# Patient Record
Sex: Male | Born: 1975 | Race: Black or African American | Hispanic: No | Marital: Single | Smoking: Never smoker
Health system: Southern US, Community
[De-identification: ages and names within clinical notes are randomized; demographics above are authoritative.]

## PROBLEM LIST (undated history)

## (undated) DIAGNOSIS — I10 Essential (primary) hypertension: Secondary | ICD-10-CM

---

## 1997-09-09 ENCOUNTER — Emergency Department (HOSPITAL_COMMUNITY): Admission: EM | Admit: 1997-09-09 | Discharge: 1997-09-09 | Payer: Self-pay | Admitting: Emergency Medicine

## 1998-03-27 ENCOUNTER — Emergency Department (HOSPITAL_COMMUNITY): Admission: EM | Admit: 1998-03-27 | Discharge: 1998-03-27 | Payer: Self-pay | Admitting: Emergency Medicine

## 1998-04-02 ENCOUNTER — Emergency Department (HOSPITAL_COMMUNITY): Admission: EM | Admit: 1998-04-02 | Discharge: 1998-04-02 | Payer: Self-pay | Admitting: Emergency Medicine

## 2004-08-10 ENCOUNTER — Emergency Department (HOSPITAL_COMMUNITY): Admission: EM | Admit: 2004-08-10 | Discharge: 2004-08-10 | Payer: Self-pay | Admitting: Emergency Medicine

## 2007-01-15 ENCOUNTER — Emergency Department (HOSPITAL_COMMUNITY): Admission: EM | Admit: 2007-01-15 | Discharge: 2007-01-16 | Payer: Self-pay | Admitting: Emergency Medicine

## 2010-05-26 ENCOUNTER — Emergency Department (HOSPITAL_COMMUNITY)
Admission: EM | Admit: 2010-05-26 | Discharge: 2010-05-26 | Disposition: A | Discharge status: Home / Self Care | Payer: Self-pay | Attending: Emergency Medicine | Admitting: Emergency Medicine

## 2010-05-26 DIAGNOSIS — K089 Disorder of teeth and supporting structures, unspecified: Secondary | ICD-10-CM | POA: Insufficient documentation

## 2010-05-26 DIAGNOSIS — K029 Dental caries, unspecified: Secondary | ICD-10-CM | POA: Insufficient documentation

## 2010-06-11 ENCOUNTER — Emergency Department (HOSPITAL_COMMUNITY)
Admission: EM | Admit: 2010-06-11 | Discharge: 2010-06-11 | Disposition: A | Discharge status: Home / Self Care | Payer: Self-pay | Attending: Emergency Medicine | Admitting: Emergency Medicine

## 2010-06-11 DIAGNOSIS — K0381 Cracked tooth: Secondary | ICD-10-CM | POA: Insufficient documentation

## 2010-06-11 DIAGNOSIS — K029 Dental caries, unspecified: Secondary | ICD-10-CM | POA: Insufficient documentation

## 2010-06-11 DIAGNOSIS — R220 Localized swelling, mass and lump, head: Secondary | ICD-10-CM | POA: Insufficient documentation

## 2011-01-18 ENCOUNTER — Emergency Department (HOSPITAL_COMMUNITY)
Admission: EM | Admit: 2011-01-18 | Discharge: 2011-01-18 | Disposition: A | Discharge status: Home / Self Care | Payer: Self-pay | Attending: Emergency Medicine | Admitting: Emergency Medicine

## 2011-01-18 ENCOUNTER — Encounter: Payer: Self-pay | Admitting: Emergency Medicine

## 2011-01-18 DIAGNOSIS — R221 Localized swelling, mass and lump, neck: Secondary | ICD-10-CM | POA: Insufficient documentation

## 2011-01-18 DIAGNOSIS — K047 Periapical abscess without sinus: Secondary | ICD-10-CM | POA: Insufficient documentation

## 2011-01-18 DIAGNOSIS — K089 Disorder of teeth and supporting structures, unspecified: Secondary | ICD-10-CM | POA: Insufficient documentation

## 2011-01-18 DIAGNOSIS — K029 Dental caries, unspecified: Secondary | ICD-10-CM | POA: Insufficient documentation

## 2011-01-18 DIAGNOSIS — R220 Localized swelling, mass and lump, head: Secondary | ICD-10-CM | POA: Insufficient documentation

## 2011-01-18 DIAGNOSIS — K0381 Cracked tooth: Secondary | ICD-10-CM | POA: Insufficient documentation

## 2011-01-18 MED ORDER — AMOXICILLIN 500 MG PO CAPS: 500.0000 mg | ORAL_CAPSULE | Freq: Three times a day (TID) | ORAL | Status: AC

## 2011-01-18 NOTE — ED Provider Notes (Signed)
History     CSN: [id number] Arrival date & time: 01/18/2011  9:30 AM   First MD Initiated Contact with Patient 01/18/11 [phone]      Chief Complaint  Patient presents with  . Facial Swelling  . Dental Injury    (Consider location/radiation/quality/duration/timing/severity/associated sxs/prior treatment) Patient is a 35 y.o. male presenting with dental injury. The history is provided by the patient.  Dental Injury This is a new problem. The current episode started in the past 7 days. The problem has been gradually worsening. Pertinent negatives include no chills, fever, headaches, neck pain or sore throat. The symptoms are aggravated by eating. He has tried nothing for the symptoms.  Pt states his tooth chipped about 6mon ago. Since then swelling on and off around that tooth. Yesterday, facial swelling. Denies pain, only when palpating the swelling. Denies fever, chills, malaise.   History reviewed. No pertinent past medical history.  History reviewed. No pertinent past surgical history.  History reviewed. No pertinent family history.  History  Substance Use Topics  . Smoking status: Never Smoker   . Smokeless tobacco: Never Used  . Alcohol Use: Yes      Review of Systems  Constitutional: Negative for fever, chills and appetite change.  HENT: Positive for facial swelling. Negative for ear pain, sore throat, mouth sores, trouble swallowing, neck pain and neck stiffness.   Eyes: Negative.   Respiratory: Negative.   Cardiovascular: Negative.   Gastrointestinal: Negative.   Genitourinary: Negative.   Skin: Negative.   Neurological: Negative for headaches.  Psychiatric/Behavioral: Negative.     Allergies  Review of patient's allergies indicates no known allergies.  Home Medications   Current Outpatient Rx  Name Route Sig Dispense Refill  . ASPIRIN PO Oral Take 1 tablet by mouth daily as needed. For swellling reducer.       BP 138/91  Pulse 80  Temp(Src) 98.1 F  (36.7 C) (Oral)  Resp 18  SpO2 99%  Physical Exam  Nursing note and vitals reviewed. Constitutional: He is oriented to person, place, and time. He appears well-developed and well-nourished. No distress.  HENT:  Head: Normocephalic and atraumatic.  Right Ear: External ear normal.  Left Ear: External ear normal.  Nose: Nose normal.  Mouth/Throat: Oropharynx is clear and moist.       Minor facial swelling over left mandible. Partial fracture and decay of left lower 1st molar. Tender to palpation. Mild surrounding gum swelling, tenderness  Eyes: Conjunctivae are normal.  Neck: Neck supple.  Cardiovascular: Normal rate, regular rhythm and normal heart sounds.   Musculoskeletal: Normal range of motion.  Lymphadenopathy:    He has no cervical adenopathy.  Neurological: He is alert and oriented to person, place, and time.  Skin: Skin is warm and dry. No rash noted.  Psychiatric: He has a normal mood and affect.    ED Course  Procedures (including critical care time)  Exam consistent with dental carries and dental abscess. Will start on antibiotics. Follow up with oral surgery.   MDM          Lottie Mussel, PA 01/18/11 1015

## 2011-01-18 NOTE — ED Notes (Signed)
Pt reports has a chipped tooth and began with abcess inside left jaw. Symptoms began on Wednesday.

## 2011-01-18 NOTE — ED Provider Notes (Signed)
Medical screening examination/treatment/procedure(s) were performed by non-physician practitioner and as supervising physician I was immediately available for consultation/collaboration.  Damoni Causby, MD 01/18/11 1151 

## 2012-12-09 ENCOUNTER — Encounter (HOSPITAL_COMMUNITY): Payer: Self-pay | Admitting: Emergency Medicine

## 2012-12-09 ENCOUNTER — Emergency Department (HOSPITAL_COMMUNITY)
Admission: EM | Admit: 2012-12-09 | Discharge: 2012-12-09 | Disposition: A | Discharge status: Home / Self Care | Payer: Self-pay | Attending: Emergency Medicine | Admitting: Emergency Medicine

## 2012-12-09 DIAGNOSIS — H6120 Impacted cerumen, unspecified ear: Secondary | ICD-10-CM | POA: Insufficient documentation

## 2012-12-09 DIAGNOSIS — H61891 Other specified disorders of right external ear: Secondary | ICD-10-CM

## 2012-12-09 DIAGNOSIS — H7190 Unspecified cholesteatoma, unspecified ear: Secondary | ICD-10-CM | POA: Insufficient documentation

## 2012-12-09 DIAGNOSIS — H6121 Impacted cerumen, right ear: Secondary | ICD-10-CM

## 2012-12-09 NOTE — ED Notes (Signed)
Pt reports "muffled sound" in right ear. Reports cleaning this AM but states "I think I pushed the wax further in." Denies pain. Ear WDL.

## 2012-12-09 NOTE — ED Notes (Signed)
Pt reports ear fullness and sounds muffled right ear. No acute distress noted.

## 2012-12-09 NOTE — ED Provider Notes (Signed)
CSN: [id number]     Arrival date & time 12/09/12  1152 History   First MD Initiated Contact with Patient 12/09/12 1213     Chief Complaint  Patient presents with  . Otalgia   (Consider location/radiation/quality/duration/timing/severity/associated sxs/prior Treatment) The history is provided by the patient.  RAMAL ECKHARDT is a 37 y.o. male history of cerumen impaction here with muffled and fullness in the right ear. Symptoms since this morning. He tried using some drops without much relief. Denies any fevers or chills or sore throat. Had similar symptoms a year ago with cerumen impaction.    History reviewed. No pertinent past medical history. History reviewed. No pertinent past surgical history. History reviewed. No pertinent family history. History  Substance Use Topics  . Smoking status: Never Smoker   . Smokeless tobacco: Never Used  . Alcohol Use: Yes    Review of Systems  HENT: Positive for ear pain.   All other systems reviewed and are negative.    Allergies  Review of patient's allergies indicates no known allergies.  Home Medications  No current outpatient prescriptions on file. BP 164/86  Pulse 81  Temp(Src) 98.2 F (36.8 C)  Resp 19  Wt 171 lb (77.565 kg)  SpO2 100% Physical Exam  Nursing note and vitals reviewed. Constitutional: He is oriented to person, place, and time. He appears well-developed and well-nourished.  HENT:  Head: Normocephalic.  Mouth/Throat: Oropharynx is clear and moist.  R cerumen impaction, L partial cerumen impaction but TM normal on L side.   Eyes: Conjunctivae are normal. Pupils are equal, round, and reactive to light.  Neck: Normal range of motion. Neck supple.  Cardiovascular: Normal rate, regular rhythm and normal heart sounds.   Pulmonary/Chest: Effort normal.  Abdominal: Soft.  Musculoskeletal: Normal range of motion.  Neurological: He is alert and oriented to person, place, and time.  Skin: Skin is warm and dry.   Psychiatric: He has a normal mood and affect. His behavior is normal. Judgment and thought content normal.    ED Course  EAR CERUMEN REMOVAL Date/Time: 12/09/2012 1:12 PM Performed by: Richardean Canal Authorized by: Richardean Canal Consent: Verbal consent obtained. Risks and benefits: risks, benefits and alternatives were discussed Consent given by: patient Patient understanding: patient states understanding of the procedure being performed Patient consent: the patient's understanding of the procedure matches consent given Procedure consent: procedure consent matches procedure scheduled Relevant documents: relevant documents present and verified Patient identity confirmed: verbally with patient and arm band Local anesthetic: none Ceruminolytics applied: Ceruminolytics applied prior to the procedure. Location details: right ear Procedure type: curette and irrigation Patient sedated: no Patient tolerance: Patient tolerated the procedure well with no immediate complications.   (including critical care time) Labs Review Labs Reviewed - No data to display Imaging Review No results found.  EKG Interpretation   None       MDM  No diagnosis found. ZIYON SOLTAU is a 37 y.o. male here with cerumen impaction. Nursing disimpacted cerumen.   1:12 PM Nurse unable to get cerumen out. I irrigated R ear and cleared out cerumen. No complications and he felt better. There seem to be a polyp in R ear. R TM seemed unremarkable. Recommend debrox drops at home and ENT f/u.    Richardean Canal, MD 12/09/12 [phone]

## 2013-09-20 ENCOUNTER — Encounter (HOSPITAL_COMMUNITY): Payer: Self-pay | Admitting: Emergency Medicine

## 2013-09-20 ENCOUNTER — Emergency Department (HOSPITAL_COMMUNITY)
Admission: EM | Admit: 2013-09-20 | Discharge: 2013-09-20 | Disposition: A | Discharge status: Home / Self Care | Payer: Self-pay | Attending: Emergency Medicine | Admitting: Emergency Medicine

## 2013-09-20 DIAGNOSIS — L03011 Cellulitis of right finger: Secondary | ICD-10-CM

## 2013-09-20 DIAGNOSIS — L03019 Cellulitis of unspecified finger: Principal | ICD-10-CM

## 2013-09-20 DIAGNOSIS — L02519 Cutaneous abscess of unspecified hand: Secondary | ICD-10-CM | POA: Insufficient documentation

## 2013-09-20 DIAGNOSIS — M79609 Pain in unspecified limb: Secondary | ICD-10-CM | POA: Insufficient documentation

## 2013-09-20 MED ORDER — CEPHALEXIN 500 MG PO CAPS: 500.0000 mg | ORAL_CAPSULE | Freq: Four times a day (QID) | ORAL | Status: DC

## 2013-09-20 NOTE — Discharge Instructions (Signed)
Please read and follow all provided instructions.  Your diagnoses today include:  1. Cellulitis of finger of right hand    Tests performed today include:  Vital signs. See below for your results today.   Medications prescribed:   Keflex (cephalexin) - antibiotic  You have been prescribed an antibiotic medicine: take the entire course of medicine even if you are feeling better. Stopping early can cause the antibiotic not to work.  Take any prescribed medications only as directed.   Home care instructions:  Follow any educational materials contained in this packet. Keep affected area above the level of your heart when possible. Soak area three times a day with warm soapy water. Do not apply alcohol or hydrogen peroxide. Cover the area if it draining or weeping.   Follow-up instructions: Return to the Emergency Department in 48 hours for a recheck if your symptoms are not significantly improved, especially if you notice worsening swelling around the nail fold.   Please follow-up with your primary care provider in the next 1 week for further evaluation of your symptoms.   Return instructions:  Return to the Emergency Department if you have:  Fever  Worsening symptoms  Worsening pain  Worsening swelling  Redness of the skin that moves away from the affected area, especially if it streaks away from the affected area   Any other emergent concerns  Your vital signs today were: BP 159/92   Pulse 87   Temp(Src) 98.8 F (37.1 C) (Oral)   Resp 18   Ht 6\' 1"  (1.854 m)   Wt 269 lb (122.018 kg)   BMI 35.50 kg/m2   SpO2 100% If your blood pressure (BP) was elevated above 135/85 this visit, please have this repeated by your doctor within one month. --------------

## 2013-09-20 NOTE — ED Provider Notes (Signed)
CSN: [id number]     Arrival date & time 09/20/13  7829 History  This chart was scribed for Justin Bleacher, PA-C, working with Justin Roots, MD by Justin Espinoza, ED Scribe. The patient was seen in TR09C/TR09C. The patient's care was started at 9:15 AM.     Chief Complaint  Patient presents with  . Hand Pain   Patient is a 38 y.o. male presenting with hand pain. The history is provided by the patient. No language interpreter was used.  Hand Pain   HPI Comments: Justin Espinoza is a 38 y.o. male who presents to the Emergency Department complaining of mild swelling and pain in his right index finger beginning three days ago. Patient reports that the swelling became more severe yesterday. He states that he has experienced similar pain in the past. He denies any recent injury or trauma to the finger. He denies biting his fingernails.   Patient does not have a PCP.   History reviewed. No pertinent past medical history. History reviewed. No pertinent past surgical history. No family history on file. History  Substance Use Topics  . Smoking status: Never Smoker   . Smokeless tobacco: Never Used  . Alcohol Use: Yes    Review of Systems  Constitutional: Negative for activity change.  Musculoskeletal: Positive for arthralgias and joint swelling. Negative for back pain, gait problem and neck pain.  Skin: Negative for wound.  Neurological: Negative for weakness and numbness.      Allergies  Review of patient's allergies indicates no known allergies.  Home Medications   Prior to Admission medications   Not on File   Triage Vitals: BP 159/92  Pulse 87  Temp(Src) 98.8 F (37.1 C) (Oral)  Resp 18  Ht 6\' 1"  (1.854 m)  Wt 269 lb (122.018 kg)  BMI 35.50 kg/m2  SpO2 100% Physical Exam  Nursing note and vitals reviewed. Constitutional: He appears well-developed and well-nourished. No distress.  HENT:  Head: Normocephalic and atraumatic.  Eyes: Conjunctivae and EOM are normal.   Neck: Normal range of motion. Neck supple. No tracheal deviation present.  Cardiovascular: Normal rate and normal pulses.   Pulmonary/Chest: Effort normal. No respiratory distress.  Musculoskeletal: Normal range of motion. He exhibits edema and tenderness.  There is very slight fullness of radial border of nail consistent with cellulitis. There is no fluctuance. No definite paronychia/eponychia.   Neurological: He is alert. No sensory deficit.  Motor, sensation, and vascular distal to the injury is fully intact.   Skin: Skin is warm and dry.  Psychiatric: He has a normal mood and affect. His behavior is normal.    ED Course  Procedures (including critical care time) DIAGNOSTIC STUDIES: Oxygen Saturation is 100% on room air, normal by my interpretation.    COORDINATION OF CARE: 9:21 AM-Discussed treatment plan which includes soaking finger in warm water and treatment with antibiotics with pt at bedside and pt agreed to plan.     Labs Review Labs Reviewed - No data to display  Imaging Review No results found.   EKG Interpretation None      9:31 AM Patient seen and examined.   Vital signs reviewed and are as follows: Filed Vitals:   09/20/13 0857  BP: 159/92  Pulse: 87  Temp: 98.8 F (37.1 C)  Resp: 18   We discussed attempt at drainage versus warm soaks/oral antibiotics/possible need for return for drainage in 48-72 hours.  Patient would like to do the latter. He understands that this  may be a developing paronychia and he may need to return for definitive drainage.  Tylenol/ibuprofen for pain.    MDM   Final diagnoses:  Cellulitis of finger of right hand   Patient with mild cellulitis of the finger with pain. No injury. There is no definite eponychia or paronychia at this time. I doubt there would be any purulence expressed with drainage. Offered attempt at drainage versus watchful waiting with oral antibiotics and patient elects to wait, soak at home.   This  chart was scribed in my presence and reviewed by me personally.   Renne CriglerJoshua Gabi Mcfate, PA-C 09/20/13 [phone]

## 2013-09-20 NOTE — ED Provider Notes (Signed)
Medical screening examination/treatment/procedure(s) were performed by non-physician practitioner and as supervising physician I was immediately available for consultation/collaboration.     Suzi RootsKevin E Kla Bily, MD 09/20/13 [phone]

## 2013-09-20 NOTE — ED Notes (Signed)
Patient states has had right index finger swelling and pain since Saturday.   Patient states worsened in the last few days.

## 2013-09-28 ENCOUNTER — Emergency Department (HOSPITAL_COMMUNITY)
Admission: EM | Admit: 2013-09-28 | Discharge: 2013-09-28 | Disposition: A | Discharge status: Home / Self Care | Payer: Self-pay | Attending: Emergency Medicine | Admitting: Emergency Medicine

## 2013-09-28 ENCOUNTER — Encounter (HOSPITAL_COMMUNITY): Payer: Self-pay | Admitting: Emergency Medicine

## 2013-09-28 DIAGNOSIS — M79609 Pain in unspecified limb: Secondary | ICD-10-CM | POA: Insufficient documentation

## 2013-09-28 DIAGNOSIS — L03019 Cellulitis of unspecified finger: Secondary | ICD-10-CM | POA: Insufficient documentation

## 2013-09-28 DIAGNOSIS — L03011 Cellulitis of right finger: Secondary | ICD-10-CM

## 2013-09-28 MED ORDER — CEPHALEXIN 500 MG PO CAPS: 500.0000 mg | ORAL_CAPSULE | Freq: Four times a day (QID) | ORAL | Status: DC

## 2013-09-28 NOTE — ED Notes (Signed)
Pt was seen here one week ago for finger pain and swelling around the cuticle, was put on antibiotics and it improved until he was biting his nail and now its swollen again.

## 2013-09-28 NOTE — Discharge Instructions (Signed)
Warm soaks several times a day. Keep clean, bacitracin topically twice a day. Follow up if not improving.    Paronychia Paronychia is an inflammatory reaction involving the folds of the skin surrounding the fingernail. This is commonly caused by an infection in the skin around a nail. The most common cause of paronychia is frequent wetting of the hands (as seen with bartenders, food servers, nurses or others who wet their hands). This makes the skin around the fingernail susceptible to infection by bacteria (germs) or fungus. Other predisposing factors are:  Aggressive manicuring.  Nail biting.  Thumb sucking. The most common cause is a staphylococcal (a type of germ) infection, or a fungal (Candida) infection. When caused by a germ, it usually comes on suddenly with redness, swelling, pus and is often painful. It may get under the nail and form an abscess (collection of pus), or form an abscess around the nail. If the nail itself is infected with a fungus, the treatment is usually prolonged and may require oral medicine for up to one year. Your caregiver will determine the length of time treatment is required. The paronychia caused by bacteria (germs) may largely be avoided by not pulling on hangnails or picking at cuticles. When the infection occurs at the tips of the finger it is called felon. When the cause of paronychia is from the herpes simplex virus (HSV) it is called herpetic whitlow. TREATMENT  When an abscess is present treatment is often incision and drainage. This means that the abscess must be cut open so the pus can get out. When this is done, the following home care instructions should be followed. HOME CARE INSTRUCTIONS   It is important to keep the affected fingers very dry. Rubber or plastic gloves over cotton gloves should be used whenever the hand must be placed in water.  Keep wound clean, dry and dressed as suggested by your caregiver between warm soaks or warm  compresses.  Soak in warm water for fifteen to twenty minutes three to four times per day for bacterial infections. Fungal infections are very difficult to treat, so often require treatment for long periods of time.  For bacterial (germ) infections take antibiotics (medicine which kill germs) as directed and finish the prescription, even if the problem appears to be solved before the medicine is gone.  Only take over-the-counter or prescription medicines for pain, discomfort, or fever as directed by your caregiver. SEEK IMMEDIATE MEDICAL CARE IF:  You have redness, swelling, or increasing pain in the wound.  You notice pus coming from the wound.  You have a fever.  You notice a bad smell coming from the wound or dressing. Document Released: 07/30/2000 Document Revised: 04/28/2011 Document Reviewed: 03/31/2008 [facility] Patient Information 2015 [employer], [place]. This information is not intended to replace advice given to you by your health care provider. Make sure you discuss any questions you have with your health care provider.

## 2013-09-28 NOTE — ED Provider Notes (Signed)
CSN: [id number]     Arrival date & time 09/28/13  2440 History  This chart was scribed for non-physician practitioner, Jaynie Crumble, PA-C working with Samuel Jester, DO, by Jarvis Morgan, ED Scribe. This patient was seen in room TR06C/TR06C and the patient's care was started at 9:23 AM.    Chief Complaint  Patient presents with  . Hand Pain      The history is provided by the patient. No language interpreter was used.   HPI Comments: Justin Espinoza is a 38 y.o. male who presents to the Emergency Department complaining of constant, aching right finger pain onset two weeks ago. The pain is localized in the right index finger. Pt states he was seen here one week ago and finger pain and swelling around the cuticle. He states he was put on antibiotics and it improved. He states that 3 days ago he was eating and he accidentally bit his finger and he notes that it is now swollen again. There is associated swelling and redness of the finger. He reports that he has been soaking his finger with soap and warm water 3x a day with mild improvement. He denies any fever, numbness, weakness, nausea, or emesis.     History reviewed. No pertinent past medical history. History reviewed. No pertinent past surgical history. History reviewed. No pertinent family history. History  Substance Use Topics  . Smoking status: Never Smoker   . Smokeless tobacco: Never Used  . Alcohol Use: Yes    Review of Systems  Constitutional: Negative for fever and chills.  Gastrointestinal: Negative for nausea and vomiting.  Musculoskeletal: Positive for arthralgias (fight index finger) and joint swelling.  Skin: Positive for color change (redness and swelling of right index finger). Negative for wound.  Neurological: Negative for weakness and numbness.  All other systems reviewed and are negative.     Allergies  Review of patient's allergies indicates no known allergies.  Home Medications   Prior to  Admission medications   Medication Sig Start Date End Date Taking? Authorizing Provider  cephALEXin (KEFLEX) 500 MG capsule Take 1 capsule (500 mg total) by mouth 4 (four) times daily. 09/20/13   Renne Crigler, PA-C   Triage Vitals: BP 152/117  Pulse 80  Temp(Src) 98.2 F (36.8 C) (Oral)  Resp 18  SpO2 100%  Physical Exam  Nursing note and vitals reviewed. Constitutional: He is oriented to person, place, and time. He appears well-developed and well-nourished. No distress.  HENT:  Head: Normocephalic and atraumatic.  Eyes: Conjunctivae and EOM are normal.  Neck: Neck supple. No tracheal deviation present.  Cardiovascular: Normal rate.   Pulmonary/Chest: Effort normal. No respiratory distress.  Musculoskeletal: Normal range of motion.  Swelling over the distal right index finger around the nail, consistent with paronychia. No drainage. No pain with ROM of the finger at all joints.   Neurological: He is alert and oriented to person, place, and time.  Skin: Skin is warm and dry.  Psychiatric: He has a normal mood and affect. His behavior is normal.    ED Course  Procedures (including critical care time)  DIAGNOSTIC STUDIES: Oxygen Saturation is 100% on RA, normal by my interpretation.    COORDINATION OF CARE: INCISION AND DRAINAGE Performed by: Jaynie Crumble A Consent: Verbal consent obtained. Risks and benefits: risks, benefits and alternatives were discussed Type: paronychia  Body area: right index finger  Anesthesia: none  Incision was made with a scalpel.   Complexity: complex Blunt dissection to break up  loculations  Drainage: purulent  Drainage amount: moderate   Patient tolerance: Patient tolerated the procedure well with no immediate complications.     Labs Review Labs Reviewed - No data to display  Imaging Review No results found.   EKG Interpretation None      MDM   Final diagnoses:  Paronychia of finger of right hand   Pt with  paronychia to right index finger. Just finished antibiotics, states it helped but swelling is worsening now. Paronychia incised and drained with  Moderate pus. Will d/c home with antibiotics, warm soaks at home, follow up as needed.   Filed Vitals:   09/28/13 0831  BP: 152/117  Pulse: 80  Temp: 98.2 F (36.8 C)  TempSrc: Oral  Resp: 18  SpO2: 100%     I personally performed the services described in this documentation, which was scribed in my presence. The recorded information has been reviewed and is accurate.      Lottie Musselatyana A Cailey Trigueros, PA-C 09/28/13 1030

## 2013-09-29 NOTE — ED Provider Notes (Signed)
Medical screening examination/treatment/procedure(s) were performed by non-physician practitioner and as supervising physician I was immediately available for consultation/collaboration.   EKG Interpretation None        Ashante Yellin, DO 09/29/13 1702 

## 2016-07-07 ENCOUNTER — Emergency Department (HOSPITAL_COMMUNITY): Payer: Self-pay

## 2016-07-07 ENCOUNTER — Encounter (HOSPITAL_COMMUNITY): Payer: Self-pay | Admitting: Emergency Medicine

## 2016-07-07 ENCOUNTER — Emergency Department (HOSPITAL_COMMUNITY)
Admission: EM | Admit: 2016-07-07 | Discharge: 2016-07-07 | Disposition: A | Discharge status: Home / Self Care | Payer: Self-pay | Attending: Emergency Medicine | Admitting: Emergency Medicine

## 2016-07-07 DIAGNOSIS — M1712 Unilateral primary osteoarthritis, left knee: Secondary | ICD-10-CM | POA: Insufficient documentation

## 2016-07-07 DIAGNOSIS — Z79899 Other long term (current) drug therapy: Secondary | ICD-10-CM | POA: Insufficient documentation

## 2016-07-07 DIAGNOSIS — M25462 Effusion, left knee: Secondary | ICD-10-CM | POA: Insufficient documentation

## 2016-07-07 NOTE — ED Provider Notes (Addendum)
[facility]PT Provider Note   CSN: [id number] Arrival date & time: 07/07/16  [id number]     History   Chief Complaint Chief Complaint  Patient presents with  . Knee Pain    HPI Justin Espinoza is a 41 y.o. male.  Patient is a healthy 41 year old male with no significant past medical history presenting today with persistent knee swelling. Patient states for years with strenuous activity or crawling on his knees they will swell and then eventually get better on their own. 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 It is not improved but he denies trying ice, elevation, compression or anti-inflammatories. He denies any pain but just persistent swelling and popping when he bends his knee. He denies any prior surgeries or known trauma.   The history is provided by the patient.  Knee Pain   This is a recurrent problem. The current episode started more than 1 week ago. The problem occurs constantly. The problem has not changed since onset.The pain is present in the left knee. The pain is at a severity of 0/10. The patient is experiencing no pain. Associated symptoms include stiffness. Associated symptoms comments: swelling. The symptoms are aggravated by activity. He has tried nothing for the symptoms. The treatment provided no relief. There has been no history of extremity trauma.    History reviewed. No pertinent past medical history.  There are no active problems to display for this patient.   History reviewed. No pertinent surgical history.     Home Medications    Prior to Admission medications   Medication Sig Start Date End Date Taking? Authorizing Provider  cephALEXin (KEFLEX) 500 MG capsule Take 1 capsule (500 mg total) by mouth 4 (four) times daily. 09/20/13   Renne CriglerGeiple, Joshua, PA-C  cephALEXin (KEFLEX) 500 MG capsule Take 1 capsule (500 mg total) by mouth 4 (four) times daily. 09/28/13   Jaynie CrumbleKirichenko, Tatyana,  PA-C    Family History No family history on file.  Social History Social History  Substance Use Topics  . Smoking status: Never Smoker  . Smokeless tobacco: Never Used  . Alcohol use Yes     Allergies   Patient has no known allergies.   Review of Systems Review of Systems  Cardiovascular:       Blood pressure is elevated today which patient states always happens when he comes to the doctor because he gets very nervous. When he checks outside of the hospital his blood pressure is normal.  Musculoskeletal: Positive for stiffness.  All other systems reviewed and are negative.    Physical Exam Updated Vital Signs BP (!) 190/106 (BP Location: Right Arm)   Pulse (!) 109   Temp 98.2 F (36.8 C) (Oral)   Resp 18   SpO2 100%   Physical Exam  Constitutional: He is oriented to person, place, and time. He appears well-developed and well-nourished. No distress.  HENT:  Head: Normocephalic and atraumatic.  Eyes: Pupils are equal, round, and reactive to light.  Cardiovascular: Normal rate.   Pulmonary/Chest: Effort normal.  Musculoskeletal: He exhibits edema. He exhibits no tenderness.       Left knee: He exhibits swelling. He exhibits normal range of motion, no deformity, no bony tenderness, normal meniscus and no MCL laxity. No tenderness found. No medial joint line, no lateral joint line, no MCL, no LCL and no patellar tendon tenderness noted.  Legs: Neurological: He is alert and oriented to person, place, and time.  Nursing note and vitals reviewed.    ED Treatments / Results  Labs (all labs ordered are listed, but only abnormal results are displayed) Labs Reviewed - No data to display  EKG  EKG Interpretation None       Radiology Dg Knee Complete 4 Views Left  Result Date: 07/07/2016 CLINICAL DATA:  Chronic pain. EXAM: LEFT KNEE - COMPLETE 4+ VIEW COMPARISON:  No prior . FINDINGS: Tricompartment degenerative change. Tiny loose body appears to be present.  Moderate knee joint effusion. No evidence of fracture dislocation IMPRESSION: Tricompartment degenerative change with small loose body. Moderate knee joint effusion . Electronically Signed   By: Maisie Fus  Register   On: 07/07/2016 09:09    Procedures Procedures (including critical care time)  Medications Ordered in ED Medications - No data to display   Initial Impression / Assessment and Plan / ED Course  I have reviewed the triage vital signs and the nursing notes.  Pertinent labs & imaging results that were available during my care of the patient were reviewed by me and considered in my medical decision making (see chart for details).     Patient with persistent knee swelling most likely from overuse. He has no pain with palpation but does have cracking and popping with bending and extending the knee.  He denies any trauma or prior surgeries. Feel most likely patient's symptoms are related to degeneration either from arthritis or cartilage issues. No signs of gout or septic joint. X-ray pending.  9:43 AM Imaging with tricompartment degenerative changes with small loose bodies and moderate joint effusion. Discussed findings with the patient. Discussed with him low impact activities and using a knee sleeve and Aleve when necessary. Given referral to orthopedics.    Final Clinical Impressions(s) / ED Diagnoses   Final diagnoses:  Effusion of left knee  Primary osteoarthritis of left knee    New Prescriptions New Prescriptions   No medications on file     Gwyneth Sprout, MD 07/07/16 1610    Gwyneth Sprout, MD 07/07/16 [phone]

## 2016-07-07 NOTE — ED Notes (Signed)
Pt transported to xray 

## 2016-07-07 NOTE — ED Notes (Signed)
ED Provider at bedside. 

## 2016-07-07 NOTE — ED Triage Notes (Signed)
Pt arrives to the ED with c/o of recurrent left knee swelling pt states this has happen before after physical activity of bending down usually resolves after a week. Pt states this time left knee has been swollen for over 1 week, denies any pain just feels "hard to bend". Pt is ambulatory with steady gait.  Joint is appropriate color for ethnicity and warm. Mildly swollen.

## 2016-10-03 ENCOUNTER — Encounter (HOSPITAL_COMMUNITY): Payer: Self-pay | Admitting: *Deleted

## 2016-10-03 ENCOUNTER — Emergency Department (HOSPITAL_COMMUNITY): Payer: Self-pay

## 2016-10-03 ENCOUNTER — Emergency Department (HOSPITAL_COMMUNITY)
Admission: EM | Admit: 2016-10-03 | Discharge: 2016-10-03 | Disposition: A | Discharge status: Home / Self Care | Payer: Self-pay | Attending: Emergency Medicine | Admitting: Emergency Medicine

## 2016-10-03 DIAGNOSIS — S62394A Other fracture of fourth metacarpal bone, right hand, initial encounter for closed fracture: Secondary | ICD-10-CM | POA: Insufficient documentation

## 2016-10-03 DIAGNOSIS — Y998 Other external cause status: Secondary | ICD-10-CM | POA: Insufficient documentation

## 2016-10-03 DIAGNOSIS — Y939 Activity, unspecified: Secondary | ICD-10-CM | POA: Insufficient documentation

## 2016-10-03 DIAGNOSIS — Y929 Unspecified place or not applicable: Secondary | ICD-10-CM | POA: Insufficient documentation

## 2016-10-03 DIAGNOSIS — W228XXA Striking against or struck by other objects, initial encounter: Secondary | ICD-10-CM | POA: Insufficient documentation

## 2016-10-03 NOTE — Progress Notes (Signed)
Orthopedic Tech Progress Note Patient Details:  Justin Espinoza 07-21-1975 [id number]  Ortho Devices Type of Ortho Device: Ace wrap, Ulna gutter splint Ortho Device/Splint Interventions: Application   Saul Fordyce 10/03/2016, 12:41 PM
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
## 2016-10-03 NOTE — ED Notes (Signed)
Ortho paged. 

## 2016-10-03 NOTE — Discharge Instructions (Signed)
Keep the splint on until evaluated by orthopedics. Use ibuprofen or Tylenol as needed for pain control. Follow-up with orthopedic doctor for further evaluation of your hand. Return to the emergency room if you develop worsening pain, numbness, tingling, or any new or worsening symptoms.
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
## 2016-11-18 ENCOUNTER — Emergency Department (HOSPITAL_COMMUNITY)
Admission: EM | Admit: 2016-11-18 | Discharge: 2016-11-18 | Disposition: A | Discharge status: Home / Self Care | Payer: Self-pay | Attending: Emergency Medicine | Admitting: Emergency Medicine

## 2016-11-18 ENCOUNTER — Encounter (HOSPITAL_COMMUNITY): Payer: Self-pay

## 2016-11-18 DIAGNOSIS — M79644 Pain in right finger(s): Secondary | ICD-10-CM | POA: Insufficient documentation

## 2016-11-18 DIAGNOSIS — M79645 Pain in left finger(s): Secondary | ICD-10-CM | POA: Insufficient documentation

## 2016-11-18 DIAGNOSIS — Z79899 Other long term (current) drug therapy: Secondary | ICD-10-CM | POA: Insufficient documentation

## 2016-11-18 NOTE — Discharge Instructions (Signed)
Please read instructions below.  Soak/flush your fingers with warm water, multiple times per day. You can take Advil/ibuprofen every 6 hours as needed for pain. Follow up with your primary care or urgent care if you develop worsening pain and swelling. Return to the ER for fever, worsening redness, or new or worsening symptoms.

## 2016-11-18 NOTE — ED Triage Notes (Signed)
Pt presents for evaluation of L middle finger. 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 Denies pain to area. No redness or significant swelling. Denies fevers. Reports hx of same.

## 2016-11-18 NOTE — ED Provider Notes (Signed)
[facility] Provider Note   CSN: [id number] Arrival date & time: 11/18/16  2725     History   Chief Complaint Chief Complaint  Patient presents with  . Finger Injury    HPI Justin Espinoza is a 41 y.o. male Sunday with acute onset of left third finger and right fifth finger pain surrounding the nail that began yesterday. 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 States he has had a cuticle infection in the past, which was lanced. He denies purulent drainage, swelling, decreased range of motion, wounds, fever or chills, or other complaints. Denies recent injuries. No history of immunocompromise.  The history is provided by the patient.    History reviewed. No pertinent past medical history.  There are no active problems to display for this patient.   History reviewed. No pertinent surgical history.     Home Medications    Prior to Admission medications   Medication Sig Start Date End Date Taking? Authorizing Provider  cephALEXin (KEFLEX) 500 MG capsule Take 1 capsule (500 mg total) by mouth 4 (four) times daily. 09/20/13   Renne Crigler, PA-C  cephALEXin (KEFLEX) 500 MG capsule Take 1 capsule (500 mg total) by mouth 4 (four) times daily. 09/28/13   Jaynie Crumble, PA-C    Family History No family history on file.  Social History Social History  Substance Use Topics  . Smoking status: Never Smoker  . Smokeless tobacco: Never Used  . Alcohol use Yes     Allergies   Patient has no known allergies.   Review of Systems Review of Systems  Musculoskeletal: Negative for joint swelling.       Finger pain  Skin: Negative for color change and wound.     Physical Exam Updated Vital Signs BP (!) 180/116 (BP Location: Right Arm)   Pulse 90   Temp 98.3 F (36.8 C) (Oral)   Resp 16   Ht  (1.854 m)   Wt 122.9 kg (271 lb)   SpO2 100%   BMI 35.75 kg/m   Physical Exam    Constitutional: He appears well-developed and well-nourished. No distress.  HENT:  Head: Normocephalic and atraumatic.  Eyes: Conjunctivae are normal.  Cardiovascular: Normal rate and intact distal pulses.   Pulmonary/Chest: Effort normal.  Musculoskeletal:  Left middle finger with mild tenderness over lateral aspect surrounding the nail, however not erythematous, fluctuant or edematous. Right fifth finger with mild tenderness over the lateral aspect surrounding fingernail, however not erythematous fluctuant or edematous. Digits with full range of motion.  Psychiatric: He has a normal mood and affect. His behavior is normal.  Nursing note and vitals reviewed.    ED Treatments / Results  Labs (all labs ordered are listed, but only abnormal results are displayed) Labs Reviewed - No data to display  EKG  EKG Interpretation None       Radiology No results found.  Procedures Procedures (including critical care time)  Medications Ordered in ED Medications - No data to display   Initial Impression / Assessment and Plan / ED Course  I have reviewed the triage vital signs and the nursing notes.  Pertinent labs & imaging results that were available during my care of the patient were reviewed by me and considered in my medical decision making (see chart for details).    Pt with finger pain, with concern of developing paronychia. On exam no fluctuant abscess noted,  minimal to no erythema or edema. Recommend warm compresses and monitoring at home. Discussed strict return precautions and follow-up instructions. Also recommended PCP follow-up for blood pressure. Per chart review, multiple visits with elevated blood pressure, however patient is asymptomatic today.  Discussed results, findings, treatment and follow up. Patient advised of return precautions. Patient verbalized understanding and agreed with plan.  Final Clinical Impressions(s) / ED Diagnoses   Final diagnoses:  Pain  of left middle finger  Pain of finger of right hand    New Prescriptions New Prescriptions   No medications on file     Russo, [place] N, PA-C 11/18/16 1201    Charlynne Pander, MD 11/19/16 [phone]

## 2017-06-01 ENCOUNTER — Encounter (HOSPITAL_COMMUNITY): Payer: Self-pay | Admitting: Emergency Medicine

## 2017-06-01 ENCOUNTER — Emergency Department (HOSPITAL_COMMUNITY)
Admission: EM | Admit: 2017-06-01 | Discharge: 2017-06-01 | Disposition: A | Discharge status: Home / Self Care | Payer: Self-pay | Attending: Emergency Medicine | Admitting: Emergency Medicine

## 2017-06-01 DIAGNOSIS — H6121 Impacted cerumen, right ear: Secondary | ICD-10-CM | POA: Insufficient documentation

## 2017-06-01 DIAGNOSIS — H6123 Impacted cerumen, bilateral: Secondary | ICD-10-CM

## 2017-06-01 MED ORDER — DOCUSATE SODIUM 50 MG/5ML PO LIQD
50.0000 mg | Freq: Once | ORAL | Status: AC
  Administered 2017-06-01: 50 mg via OTIC
  Filled 2017-06-01: qty 10

## 2017-06-01 NOTE — ED Provider Notes (Signed)
TIME SEEN: 4:30 AM  CHIEF COMPLAINT: Cerumen impaction  HPI: Patient is a 42 year old male with history of previous cerumen impaction who presents to the emergency department with fullness and discomfort in the right ear.  He suspects that he has another cerumen impaction.  No sore throat or dental pain.  No facial redness, swelling.  No fever.  States he does not use Q-tips but continues to get recurrent cerumen impactions.  No pain in the left ear.  ROS: See HPI Constitutional: no fever  Eyes: no drainage  ENT: no runny nose   Cardiovascular:  no chest pain  Resp: no SOB  GI: no vomiting GU: no dysuria Integumentary: no rash  Allergy: no hives  Musculoskeletal: no leg swelling  Neurological: no slurred speech ROS otherwise negative  PAST MEDICAL HISTORY/PAST SURGICAL HISTORY:  History reviewed. No pertinent past medical history.  MEDICATIONS:  Prior to Admission medications   Medication Sig Start Date End Date Taking? Authorizing Provider  cephALEXin (KEFLEX) 500 MG capsule Take 1 capsule (500 mg total) by mouth 4 (four) times daily. 09/20/13   Renne CriglerGeiple, Joshua, PA-C  cephALEXin (KEFLEX) 500 MG capsule Take 1 capsule (500 mg total) by mouth 4 (four) times daily. 09/28/13   Kirichenko, Lemont Fillersatyana, PA-C    ALLERGIES:  No Known Allergies  SOCIAL HISTORY:  Social History   Tobacco Use  . Smoking status: Never Smoker  . Smokeless tobacco: Never Used  Substance Use Topics  . Alcohol use: Yes    FAMILY HISTORY: No family history on file.  EXAM: BP (!) 190/102 (BP Location: Left Arm)   Pulse 99   Temp 98.1 F (36.7 C) (Oral)   Resp 16   Ht 6\' 1"  (1.854 m)   Wt 122.5 kg (270 lb)   SpO2 100%   BMI 35.62 kg/m  CONSTITUTIONAL: Alert and oriented and responds appropriately to questions. Well-appearing; well-nourished HEAD: Normocephalic EYES: Conjunctivae clear, pupils appear equal, EOMI ENT: normal nose; moist mucous membranes; No pharyngeal erythema or petechiae, no  tonsillar hypertrophy or exudate, no uvular deviation, no unilateral swelling, no trismus or drooling, no muffled voice, normal phonation, no stridor, no dental caries present, no drainable dental abscess noted, no Ludwig's angina, tongue sits flat in the bottom of the mouth, no angioedema, no facial erythema or warmth, no facial swelling; no pain with movement of the neck.  Patient has bilateral cerumen impaction and I am unable to visualize either TM.  After cerumen has been removed from the right ear the right TM is TMs are clear without erythema, purulence, bulging, perforation, effusion.  No sign of foreign body in the external auditory canal. No inflammation, erythema or drainage from the external auditory canal. No signs of mastoiditis. No pain with manipulation of the pinna bilaterally.  Still unable to visualize the left TM.  Patient refuses cerumen impaction of the left side. NECK: Supple, no meningismus, no nuchal rigidity, no LAD  CARD: RRR; S1 and S2 appreciated; no murmurs, no clicks, no rubs, no gallops RESP: Normal chest excursion without splinting or tachypnea; breath sounds clear and equal bilaterally; no wheezes, no rhonchi, no rales, no hypoxia or respiratory distress, speaking full sentences ABD/GI: Normal bowel sounds; non-distended; soft, non-tender, no rebound, no guarding, no peritoneal signs, no hepatosplenomegaly BACK:  The back appears normal and is non-tender to palpation, there is no CVA tenderness EXT: Normal ROM in all joints; non-tender to palpation; no edema; normal capillary refill; no cyanosis, no calf tenderness or swelling  SKIN: Normal color for age and race; warm; no rash NEURO: Moves all extremities equally PSYCH: The patient's mood and manner are appropriate. Grooming and personal hygiene are appropriate.  MEDICAL DECISION MAKING: Patient here with bilateral cerumen impaction.  Cerumen has been disimpacted from the right ear.  He refuses disimpaction from the  left side.  No sign of superimposed infection, effusion or perforation.  Reports feeling better.  Will discharge home.  At this time, I do not feel there is any life-threatening condition present. I have reviewed and discussed all results (EKG, imaging, lab, urine as appropriate) and exam findings with patient/family. I have reviewed nursing notes and appropriate previous records.  I feel the patient is safe to be discharged home without further emergent workup and can continue workup as an outpatient as needed. Discussed usual and customary return precautions. Patient/family verbalize understanding and are comfortable with this plan.  Outpatient follow-up has been provided if needed. All questions have been answered.   .Ear Cerumen Removal Date/Time: 06/01/2017 6:39 AM Performed by: Kizzy Olafson, Layla Maw, DO Authorized by: Odalys Win, Layla Maw, DO   Consent:    Consent obtained:  Verbal   Consent given by:  Patient   Risks discussed:  Bleeding, dizziness, incomplete removal, infection, pain and TM perforation   Alternatives discussed:  No treatment Procedure details:    Location:  R ear   Procedure type: irrigation   Post-procedure details:    Inspection:  TM intact   Hearing quality:  Improved   Patient tolerance of procedure:  Tolerated well, no immediate complications       Jaycub Noorani, Layla Maw, DO 06/01/17 [phone]

## 2017-06-01 NOTE — ED Triage Notes (Signed)
Pt presents with R ear fullness. Pt reports he has had impactions in the past and feels this may be what is causing discomfort.

## 2017-06-01 NOTE — Discharge Instructions (Signed)
To find a primary care or specialty doctor please call [phone] or [phone] to access "[facility] Find a Doctor Service." ° °You may also go on the [facility] website at [email or web address] ° °There are also multiple [facility], [facility] and [facility] practices throughout the [place] that are frequently accepting new patients. You may find a clinic that is close to your home and contact them. ° °[facility] -  °[address] °[place] [address] °[phone] ° ° °[facility] -  °[address] °[place] [address] °[phone] ° ° °[facility] - °[address]  °[place] [address] °[phone] ° ° °
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
## 2019-08-07 ENCOUNTER — Encounter (HOSPITAL_COMMUNITY): Payer: Self-pay

## 2019-08-07 ENCOUNTER — Ambulatory Visit (HOSPITAL_COMMUNITY)
Admission: EM | Admit: 2019-08-07 | Discharge: 2019-08-07 | Disposition: A | Discharge status: Home / Self Care | Payer: Self-pay | Attending: Family Medicine | Admitting: Family Medicine

## 2019-08-07 ENCOUNTER — Other Ambulatory Visit: Payer: Self-pay

## 2019-08-07 DIAGNOSIS — K0889 Other specified disorders of teeth and supporting structures: Secondary | ICD-10-CM | POA: Insufficient documentation

## 2019-08-07 DIAGNOSIS — K029 Dental caries, unspecified: Secondary | ICD-10-CM | POA: Insufficient documentation

## 2019-08-07 DIAGNOSIS — I10 Essential (primary) hypertension: Secondary | ICD-10-CM | POA: Insufficient documentation

## 2019-08-07 HISTORY — DX: Essential (primary) hypertension: I10

## 2019-08-07 LAB — CBC
HCT: 46.2 % (ref 39.0–52.0)
Hemoglobin: 14.6 g/dL (ref 13.0–17.0)
MCH: 27.4 pg (ref 26.0–34.0)
MCHC: 31.6 g/dL (ref 30.0–36.0)
MCV: 86.8 fL (ref 80.0–100.0)
Platelets: 244 10*3/uL (ref 150–400)
RBC: 5.32 MIL/uL (ref 4.22–5.81)
RDW: 13.4 % (ref 11.5–15.5)
WBC: 6.2 10*3/uL (ref 4.0–10.5)
nRBC: 0 % (ref 0.0–0.2)

## 2019-08-07 LAB — COMPREHENSIVE METABOLIC PANEL
ALT: 22 U/L (ref 0–44)
AST: 23 U/L (ref 15–41)
Albumin: 4.4 g/dL (ref 3.5–5.0)
Alkaline Phosphatase: 87 U/L (ref 38–126)
Anion gap: 10 (ref 5–15)
BUN: 8 mg/dL (ref 6–20)
CO2: 28 mmol/L (ref 22–32)
Calcium: 9.6 mg/dL (ref 8.9–10.3)
Chloride: 100 mmol/L (ref 98–111)
Creatinine, Ser: 0.92 mg/dL (ref 0.61–1.24)
GFR calc Af Amer: >60 mL/min (ref >60)
GFR calc non Af Amer: >60 mL/min (ref >60)
Glucose, Bld: 102 mg/dL — ABNORMAL HIGH (ref 70–99)
Potassium: 3.1 mmol/L — ABNORMAL LOW (ref 3.5–5.1)
Sodium: 138 mmol/L (ref 135–145)
Total Bilirubin: 0.7 mg/dL (ref 0.3–1.2)
Total Protein: 8.4 g/dL — ABNORMAL HIGH (ref 6.5–8.1)

## 2019-08-07 MED ORDER — AMOXICILLIN 500 MG PO CAPS: 1000.0000 mg | ORAL_CAPSULE | Freq: Two times a day (BID) | ORAL | 0 refills | Status: DC

## 2019-08-07 MED ORDER — TRAMADOL HCL 50 MG PO TABS: 50.0000 mg | ORAL_TABLET | Freq: Four times a day (QID) | ORAL | 0 refills | Status: DC | PRN

## 2019-08-07 MED ORDER — AMLODIPINE BESYLATE 5 MG PO TABS: 5.0000 mg | ORAL_TABLET | Freq: Every day | ORAL | 1 refills | Status: DC

## 2019-08-07 NOTE — ED Triage Notes (Signed)
Pt needs tooth pulled on right upper mouth. Pt states his root tips are exposed, he went to dentist, but won't pull tooth until his HTN gets under control. Pt having pain in toothx59mos.

## 2019-08-07 NOTE — ED Provider Notes (Signed)
[place]    CSN: [id number] Arrival date & time: 08/07/19  1531      History   Chief Complaint Chief Complaint  Patient presents with  . Dental Pain    HPI Justin Espinoza is a 44 y.o. male.   Patient is a 44 year old male who presents today for dental pain.  Has known caries and dental fractures to right upper mouth area.  This is been ongoing issue for him for approximately 4 months.  He typically can deal with the pain but last night he was unable to sleep due to increasing pain in the upper mouth area.  Took 2 Aleve without any relief.  Has been to a dentist to see about getting these teeth extracted but they were not do this due to his elevated blood pressure.  Reports when he checks his blood pressure home is in the 462V and 035 systolic.  He believes he may have "white coat syndrome".  Here today blood pressures elevated at 188/98 and 190/101.  Does have family history of high blood pressure.  Denies any associated chest pain, shortness of breath, dizziness, headache, lightheadedness, weakness.  ROS per HPI      Past Medical History:  Diagnosis Date  . Hypertension     There are no problems to display for this patient.   History reviewed. No pertinent surgical history.     Home Medications    Prior to Admission medications   Medication Sig Start Date End Date Taking? Authorizing Provider  amLODipine (NORVASC) 5 MG tablet Take 1 tablet (5 mg total) by mouth daily. 08/07/19   Loura Halt A, NP  amoxicillin (AMOXIL) 500 MG capsule Take 2 capsules (1,000 mg total) by mouth 2 (two) times daily. 08/07/19   Loura Halt A, NP  traMADol (ULTRAM) 50 MG tablet Take 1 tablet (50 mg total) by mouth every 6 (six) hours as needed. 08/07/19   Orvan July, NP    Family History History reviewed. No pertinent family history.  Social History Social History   Tobacco Use  . Smoking status: Never Smoker  . Smokeless tobacco: Never Used  Substance Use Topics  .  Alcohol use: Not Currently  . Drug use: No     Allergies   Patient has no known allergies.   Review of Systems Review of Systems   Physical Exam Triage Vital Signs ED Triage Vitals [08/07/19 1617]  Enc Vitals Group     BP (!) 188/98     Pulse Rate 93     Resp 16     Temp 98.6 F (37 C)     Temp Source Oral     SpO2 100 %     Weight 270 lb (122.5 kg)     Height 6\' 1"  (1.854 m)     Head Circumference      Peak Flow      Pain Score 5     Pain Loc      Pain Edu?      Excl. in [place]?    No data found.  Updated Vital Signs BP (!) 188/98   Pulse 93   Temp 98.6 F (37 C) (Oral)   Resp 16   Ht 6\' 1"  (1.854 m)   Wt 270 lb (122.5 kg)   SpO2 100%   BMI 35.62 kg/m   Visual Acuity Right Eye Distance:   Left Eye Distance:   Bilateral Distance:    Right Eye Near:   Left Eye  Near:    Bilateral Near:     Physical Exam Vitals and nursing note reviewed.  Constitutional:      Appearance: Normal appearance.  HENT:     Head: Normocephalic and atraumatic.     Nose: Nose normal.     Mouth/Throat:     Comments: Multiple dental cavities and dental fractures to the root to right upper mouth.  Mild gingival swelling.  No obvious abscess Eyes:     Conjunctiva/sclera: Conjunctivae normal.  Pulmonary:     Effort: Pulmonary effort is normal.  Musculoskeletal:        General: Normal range of motion.     Cervical back: Normal range of motion.  Skin:    General: Skin is warm and dry.  Neurological:     Mental Status: He is alert.  Psychiatric:        Mood and Affect: Mood normal.      UC Treatments / Results  Labs (all labs ordered are listed, but only abnormal results are displayed) Labs Reviewed  CBC  COMPREHENSIVE METABOLIC PANEL    EKG   Radiology No results found.  Procedures Procedures (including critical care time)  Medications Ordered in UC Medications - No data to display  Initial Impression / Assessment and Plan / UC Course  I have reviewed  the triage vital signs and the nursing notes.  Pertinent labs & imaging results that were available during my care of the patient were reviewed by me and considered in my medical decision making (see chart for details).     Dental caries Local and cover for possible early dental infection with amoxicillin. Treating his pain with tramadol Recommend follow with dentist for any further issues.  Essential hypertension Patient with multiple elevated blood pressure readings here today. He has had multiple elevated blood pressures in the past also. He is unable to get any dental work done until this is under control.  We will go ahead and start him on amlodipine.  Doing some basic lab work. Contacts given for primary care for follow-up Recommend monitor blood pressures at home   Final Clinical Impressions(s) / UC Diagnoses   Final diagnoses:  Dental caries  Pain, dental  Essential hypertension     Discharge Instructions     Treating you for possible dental infection and giving you something for pain. You need to follow-up with a dentist for further management of this dental problem and pain I am also treating you for high blood pressure We will check your kidney function Starting you on amlodipine 5 mg Take this daily. Contact given for follow-up for with primary care.  Make sure you are monitoring your blood pressures at home     ED Prescriptions    Medication Sig Dispense Auth. Provider   traMADol (ULTRAM) 50 MG tablet Take 1 tablet (50 mg total) by mouth every 6 (six) hours as needed. 12 tablet Dayson Aboud A, NP   amoxicillin (AMOXIL) 500 MG capsule Take 2 capsules (1,000 mg total) by mouth 2 (two) times daily. 40 capsule Emersyn Wyss A, NP   amLODipine (NORVASC) 5 MG tablet Take 1 tablet (5 mg total) by mouth daily. 30 tablet Charyl Minervini A, NP     I have reviewed the PDMP during this encounter.   Janace Aris, NP 08/07/19 1735

## 2019-08-07 NOTE — Discharge Instructions (Signed)
Treating you for possible dental infection and giving you something for pain. You need to follow-up with a dentist for further management of this dental problem and pain I am also treating you for high blood pressure We will check your kidney function Starting you on amlodipine 5 mg Take this daily. Contact given for follow-up for with primary care.  Make sure you are monitoring your blood pressures at home

## 2020-02-03 ENCOUNTER — Other Ambulatory Visit: Payer: Self-pay

## 2020-02-03 ENCOUNTER — Encounter (HOSPITAL_COMMUNITY): Payer: Self-pay

## 2020-02-03 ENCOUNTER — Ambulatory Visit (HOSPITAL_COMMUNITY)
Admission: EM | Admit: 2020-02-03 | Discharge: 2020-02-03 | Disposition: A | Discharge status: Home / Self Care | Payer: HRSA Program | Attending: Family Medicine | Admitting: Family Medicine

## 2020-02-03 DIAGNOSIS — Z79899 Other long term (current) drug therapy: Secondary | ICD-10-CM | POA: Insufficient documentation

## 2020-02-03 DIAGNOSIS — R519 Headache, unspecified: Secondary | ICD-10-CM | POA: Diagnosis present

## 2020-02-03 DIAGNOSIS — U071 COVID-19: Secondary | ICD-10-CM | POA: Diagnosis not present

## 2020-02-03 DIAGNOSIS — R6889 Other general symptoms and signs: Secondary | ICD-10-CM

## 2020-02-03 LAB — RESP PANEL BY RT-PCR (FLU A&B, COVID) ARPGX2
Influenza A by PCR: NEGATIVE
Influenza B by PCR: NEGATIVE
SARS Coronavirus 2 by RT PCR: POSITIVE — AB

## 2020-02-03 NOTE — ED Triage Notes (Signed)
Pt in with c/o facial pressure, headache and chills that started on Tuesday.   Pt had advil with some relief

## 2020-02-03 NOTE — Discharge Instructions (Signed)
May take tylenol and ibuprofen for pain and fever control, sudafed, flonase for sinus pressure. Rest, stay home until test results back and sxs improve.

## 2020-02-03 NOTE — ED Provider Notes (Signed)
[facility]    CSN: [id number] Arrival date & time: 02/03/20  1211      History   Chief Complaint Chief Complaint  Patient presents with  . Headache  . Chills  . facial pressure    HPI Justin Espinoza is a 44 y.o. male.   Here today with 3 days of fever, chills, body aches, sinus pressure and pain, sinus headache. Denies congestion, sore throat, cough, wheezing, CP, SOB, N/V/D. So far not taking anything for sxs. No known sick contacts, no pertinent medical history. Took a home COVID test which was negative.       Past Medical History:  Diagnosis Date  . Hypertension     There are no problems to display for this patient.   History reviewed. No pertinent surgical history.     Home Medications    Prior to Admission medications   Medication Sig Start Date End Date Taking? Authorizing Provider  amLODipine (NORVASC) 5 MG tablet Take 1 tablet (5 mg total) by mouth daily. 08/07/19   Dahlia Byes A, NP  amoxicillin (AMOXIL) 500 MG capsule Take 2 capsules (1,000 mg total) by mouth 2 (two) times daily. 08/07/19   Dahlia Byes A, NP  traMADol (ULTRAM) 50 MG tablet Take 1 tablet (50 mg total) by mouth every 6 (six) hours as needed. 08/07/19   Janace Aris, NP    Family History Family History  Problem Relation Age of Onset  . Hypertension Mother   . Diabetes Mother   . Hypertension Father   . Diabetes Father     Social History Social History   Tobacco Use  . Smoking status: Never Smoker  . Smokeless tobacco: Never Used  Substance Use Topics  . Alcohol use: Not Currently  . Drug use: No     Allergies   Patient has no known allergies.   Review of Systems Review of Systems PER HPI    Physical Exam Triage Vital Signs ED Triage Vitals  Enc Vitals Group     BP 02/03/20 1356 (!) 172/100     Pulse Rate 02/03/20 1356 90     Resp 02/03/20 1356 20     Temp 02/03/20 1356 (!) 101 F (38.3 C)     Temp Source 02/03/20 1356 Oral     SpO2 02/03/20  1356 100 %     Weight --      Height --      Head Circumference --      Peak Flow --      Pain Score 02/03/20 1355 5     Pain Loc --      Pain Edu? --      Excl. in [place]? --    No data found.  Updated Vital Signs BP (!) 172/100 (BP Location: Right Arm)   Pulse 90   Temp (!) 101 F (38.3 C) (Oral)   Resp 20   SpO2 100%   Visual Acuity Right Eye Distance:   Left Eye Distance:   Bilateral Distance:    Right Eye Near:   Left Eye Near:    Bilateral Near:     Physical Exam Vitals and nursing note reviewed.  Constitutional:      Appearance: Normal appearance.  HENT:     Head: Atraumatic.     Right Ear: Tympanic membrane normal.     Left Ear: Tympanic membrane normal.     Nose: Nose normal.     Mouth/Throat:     Mouth: Mucous  membranes are moist.     Pharynx: Oropharynx is clear.  Eyes:     Extraocular Movements: Extraocular movements intact.     Conjunctiva/sclera: Conjunctivae normal.  Cardiovascular:     Rate and Rhythm: Normal rate and regular rhythm.     Heart sounds: Normal heart sounds.  Pulmonary:     Effort: Pulmonary effort is normal.     Breath sounds: Normal breath sounds.  Abdominal:     General: Bowel sounds are normal. There is no distension.     Palpations: Abdomen is soft.     Tenderness: There is no abdominal tenderness. There is no guarding.  Musculoskeletal:        General: Normal range of motion.     Cervical back: Normal range of motion and neck supple.  Skin:    General: Skin is warm and dry.  Neurological:     General: No focal deficit present.     Mental Status: He is oriented to person, place, and time.  Psychiatric:        Mood and Affect: Mood normal.        Thought Content: Thought content normal.        Judgment: Judgment normal.     UC Treatments / Results  Labs (all labs ordered are listed, but only abnormal results are displayed) Labs Reviewed  RESP PANEL BY RT-PCR (FLU A&B, COVID) [id number]    EKG   Radiology No  results found.  Procedures Procedures (including critical care time)  Medications Ordered in UC Medications - No data to display  Initial Impression / Assessment and Plan / UC Course  I have reviewed the triage vital signs and the nursing notes.  Pertinent labs & imaging results that were available during my care of the patient were reviewed by me and considered in my medical decision making (see chart for details).     Resp swab pending, discussed OTC fever reducers, work note given, supportive care reviewed. Return for worsening sxs.   Final Clinical Impressions(s) / UC Diagnoses   Final diagnoses:  Flu-like symptoms     Discharge Instructions     May take tylenol and ibuprofen for pain and fever control, sudafed, flonase for sinus pressure. Rest, stay home until test results back and sxs improve.     ED Prescriptions    None     PDMP not reviewed this encounter.   Particia Nearing, [place] 02/03/20 1432

## 2020-02-06 ENCOUNTER — Ambulatory Visit (HOSPITAL_COMMUNITY)
Admission: RE | Admit: 2020-02-06 | Discharge: 2020-02-06 | Disposition: A | Discharge status: Home / Self Care | Payer: Self-pay | Source: Ambulatory Visit | Attending: Pulmonary Disease | Admitting: Pulmonary Disease

## 2020-02-06 ENCOUNTER — Other Ambulatory Visit: Payer: Self-pay | Admitting: Nurse Practitioner

## 2020-02-06 ENCOUNTER — Other Ambulatory Visit (HOSPITAL_COMMUNITY): Payer: Self-pay

## 2020-02-06 DIAGNOSIS — I10 Essential (primary) hypertension: Secondary | ICD-10-CM | POA: Insufficient documentation

## 2020-02-06 DIAGNOSIS — U071 COVID-19: Secondary | ICD-10-CM | POA: Insufficient documentation

## 2020-02-06 MED ORDER — EPINEPHRINE 0.3 MG/0.3ML IJ SOAJ: 0.3000 mg | Freq: Once | INTRAMUSCULAR | Status: DC | PRN

## 2020-02-06 MED ORDER — DIPHENHYDRAMINE HCL 50 MG/ML IJ SOLN: 50.0000 mg | Freq: Once | INTRAMUSCULAR | Status: DC | PRN

## 2020-02-06 MED ORDER — SODIUM CHLORIDE 0.9 % IV SOLN: INTRAVENOUS | Status: DC | PRN

## 2020-02-06 MED ORDER — ALBUTEROL SULFATE HFA 108 (90 BASE) MCG/ACT IN AERS: 2.0000 | INHALATION_SPRAY | Freq: Once | RESPIRATORY_TRACT | Status: DC | PRN

## 2020-02-06 MED ORDER — SODIUM CHLORIDE 0.9 % IV SOLN: Freq: Once | INTRAVENOUS | Status: AC

## 2020-02-06 MED ORDER — FAMOTIDINE IN NACL 20-0.9 MG/50ML-% IV SOLN: 20.0000 mg | Freq: Once | INTRAVENOUS | Status: DC | PRN

## 2020-02-06 MED ORDER — METHYLPREDNISOLONE SODIUM SUCC 125 MG IJ SOLR: 125.0000 mg | Freq: Once | INTRAMUSCULAR | Status: DC | PRN

## 2020-02-06 NOTE — Progress Notes (Signed)
Patient reviewed Fact Sheet for Patients, Parents, and Caregivers for Emergency Use Authorization (EUA) of bamlanivimab and etesevimab for the Treatment of Coronavirus. Patient also reviewed and is agreeable to the estimated cost of treatment. Patient is agreeable to proceed.   

## 2020-02-06 NOTE — Discharge Instructions (Signed)
10 Things You Can Do to Manage Your COVID-19 Symptoms at Home If you have possible or confirmed COVID-19: 1. Stay home from work and school. And stay away from other public places. If you must go out, avoid using any kind of public transportation, ridesharing, or taxis. 2. Monitor your symptoms carefully. If your symptoms get worse, call your healthcare provider immediately. 3. Get rest and stay hydrated. 4. If you have a medical appointment, call the healthcare provider ahead of time and tell them that you have or may have COVID-19. 5. For medical emergencies, call 911 and notify the dispatch personnel that you have or may have COVID-19. 6. Cover your cough and sneezes with a tissue or use the inside of your elbow. 7. Wash your hands often with soap and water for at least 20 seconds or clean your hands with an alcohol-based hand sanitizer that contains at least 60% alcohol. 8. As much as possible, stay in a specific room and away from other people in your home. Also, you should use a separate bathroom, if available. If you need to be around other people in or outside of the home, wear a mask. 9. Avoid sharing personal items with other people in your household, like dishes, towels, and bedding. 10. Clean all surfaces that are touched often, like counters, tabletops, and doorknobs. Use household cleaning sprays or wipes according to the label instructions. cdc.gov/coronavirus 08/18/2018 This information is not intended to replace advice given to you by your health care provider. Make sure you discuss any questions you have with your health care provider. Document Revised: 01/20/2019 Document Reviewed: 01/20/2019 Elsevier Patient Education  2020 Elsevier Inc. What types of side effects do monoclonal antibody drugs cause?  Common side effects  In general, the more common side effects caused by monoclonal antibody drugs include: . Allergic reactions, such as hives or itching . Flu-like signs and  symptoms, including chills, fatigue, fever, and muscle aches and pains . Nausea, vomiting . Diarrhea . Skin rashes . Low blood pressure   The CDC is recommending patients who receive monoclonal antibody treatments wait at least 90 days before being vaccinated.  Currently, there are no data on the safety and efficacy of mRNA COVID-19 vaccines in persons who received monoclonal antibodies or convalescent plasma as part of COVID-19 treatment. Based on the estimated half-life of such therapies as well as evidence suggesting that reinfection is uncommon in the 90 days after initial infection, vaccination should be deferred for at least 90 days, as a precautionary measure until additional information becomes available, to avoid interference of the antibody treatment with vaccine-induced immune responses. If you have any questions or concerns after the infusion please call the Advanced Practice Provider on call at [phone]. This number is ONLY intended for your use regarding questions or concerns about the infusion post-treatment side-effects.  Please do not provide this number to others for use. For return to work notes please contact your primary care provider.   If someone you know is interested in receiving treatment please have them call the COVID hotline at [phone].   

## 2020-02-06 NOTE — Progress Notes (Addendum)
Note: BP elevated pre-infusion and post-infusion; pt states his BP goes up at in medical situations.  States he is anxious and did not take his BP medicine this morning.   He denies having headache, shortness of breath or chest pain.  Pt agrees to check his BP twice daily and will call PCP if BP >150/90.    Diagnosis: COVID-19  Physician:Dr. Shan Levans  Procedure: Covid Infusion Clinic Med: bamlanivimab\etesevimab infusion - Provided patient with bamlanimivab\etesevimab fact sheet for patients, parents and caregivers prior to infusion.  Complications: No immediate complications noted.  Discharge: Discharged home   Marilynn Rail 02/06/2020

## 2020-02-07 ENCOUNTER — Ambulatory Visit (HOSPITAL_COMMUNITY)
Admission: EM | Admit: 2020-02-07 | Discharge: 2020-02-07 | Disposition: A | Discharge status: Home / Self Care | Payer: Self-pay | Attending: Emergency Medicine | Admitting: Emergency Medicine

## 2020-02-07 ENCOUNTER — Encounter (HOSPITAL_COMMUNITY): Payer: Self-pay

## 2020-02-07 ENCOUNTER — Ambulatory Visit (INDEPENDENT_AMBULATORY_CARE_PROVIDER_SITE_OTHER): Payer: HRSA Program

## 2020-02-07 ENCOUNTER — Other Ambulatory Visit: Payer: Self-pay

## 2020-02-07 DIAGNOSIS — U071 COVID-19: Secondary | ICD-10-CM | POA: Diagnosis not present

## 2020-02-07 DIAGNOSIS — I10 Essential (primary) hypertension: Secondary | ICD-10-CM | POA: Diagnosis not present

## 2020-02-07 DIAGNOSIS — R042 Hemoptysis: Secondary | ICD-10-CM | POA: Diagnosis not present

## 2020-02-07 MED ORDER — BENZONATATE 200 MG PO CAPS: 200.0000 mg | ORAL_CAPSULE | Freq: Three times a day (TID) | ORAL | 0 refills | Status: DC | PRN

## 2020-02-07 MED ORDER — AEROCHAMBER PLUS MISC: 2 refills | Status: AC

## 2020-02-07 MED ORDER — ALBUTEROL SULFATE HFA 108 (90 BASE) MCG/ACT IN AERS: 1.0000 | INHALATION_SPRAY | RESPIRATORY_TRACT | 0 refills | Status: AC | PRN

## 2020-02-07 NOTE — Discharge Instructions (Addendum)
Start Flonase, [place], may also take Robitussin for cough.  You can try 2 puffs from your albuterol inhaler using your spacer every 4-6 hours as needed for cough to see if this does not open up your lungs.  Decrease your salt intake. diet and exercise will lower your blood pressure significantly. It is important to keep your blood pressure under good control, as having a elevated blood pressure for prolonged periods of time significantly increases your risk of stroke, heart attacks, kidney damage, eye damage, and other problems. Measure your blood pressure once a day, preferably at the same time every day. Keep a log of this and bring it to your next doctor's appointment.  Bring your blood pressure cuff as well.   Return immediately to the ER if you start having chest pain, headache, problems seeing, problems talking, problems walking, if you feel like you're about to pass out, if you do pass out, if you have a seizure, or for any other concerns.  Below is a list of primary care practices who are taking new patients for you to follow-up with.  [facility] internal medicine clinic Ground Floor - [facility], [address] [place], [place], [place] [address] [phone]  [facility] [place] [facility] [address] [place], [place] [address] [phone]  [facility] [address] [place], [place] [address] [phone]  [facility] [phone] [address] [place] [address]  [facility]: [address] [place] [address]  [phone]  [facility]: [address] [place] [address]   [phone]  [facility]: [address] [place] [address]  [phone]  [facility] : [address] [place] [address] [phone]  [facility]: [address] [place] [address] [phone]  [facility]: [address] [place] [address] [phone]  Dr. Oneal Grout [address] [facility] [place] [address]  [phone]  Dr. Jackie Plum, [facility]. [address] [place], [place] [address]  [phone]  Go to www.goodrx.com to look up your medications. This will give you a list of where you can find your prescriptions at the most affordable prices. Or ask the pharmacist what the cash price is, or if they have any other discount programs available to help make your medication more affordable. This can be less expensive than what you would pay with insurance.

## 2020-02-07 NOTE — ED Triage Notes (Signed)
Pt presents for follow up; pt states he coughed a little blood with his phlegm yesterday.

## 2020-02-07 NOTE — ED Provider Notes (Signed)
HPI  SUBJECTIVE:  Justin Espinoza is a 44 y.o. male who presents with an episode of hemoptysis this morning.  States that he was coughing "a lot" yesterday.  He was diagnosed with Covid 5 days ago on 12/17.  He had a monoclonal antibody infusion yesterday and states that he is feeling much better today.  He is concerned about pneumonia.  He denies coughing up clots, fevers, chest pain, shortness of breath, wheezing, hematemesis, melena, hematochezia.  No recent vomiting.  No aggravating or alleviating factors.  Has not tried anything for this.  He is not on any anticoagulants or antiplatelets.  He is not a smoker, no history of diabetes, pulmonary disease.  He has a history of hypertension, did not take his medications this morning.  States that it has been running higher than usual, in the 170s/90s recently.  He states it normally is between 130-140/80.  PMD: None.   Past Medical History:  Diagnosis Date  . Hypertension     History reviewed. No pertinent surgical history.  Family History  Problem Relation Age of Onset  . Hypertension Mother   . Diabetes Mother   . Hypertension Father   . Diabetes Father     Social History   Tobacco Use  . Smoking status: Never Smoker  . Smokeless tobacco: Never Used  Substance Use Topics  . Alcohol use: Not Currently  . Drug use: No    No current facility-administered medications for this encounter.  Current Outpatient Medications:  .  amLODipine (NORVASC) 5 MG tablet, Take 1 tablet (5 mg total) by mouth daily., Disp: 30 tablet, Rfl: 1 .  benzonatate (TESSALON) 200 MG capsule, Take 1 capsule (200 mg total) by mouth 3 (three) times daily as needed for cough., Disp: 30 capsule, Rfl: 0  No Known Allergies   ROS  As noted in HPI.   Physical Exam  BP (!) 176/99 (BP Location: Left Arm)   Pulse 95   Temp 99 F (37.2 C) (Oral)   Resp 20   SpO2 100%   Constitutional: Well developed, well nourished, no acute distress coughing. Eyes:   EOMI, conjunctiva normal bilaterally HENT: Normocephalic, atraumatic,mucus membranes moist.  No blood in oropharynx. Respiratory: Normal inspiratory effort lungs clear bilaterally good air movement.  No anterior, lateral chest wall tenderness. Cardiovascular: Normal rate regular rhythm no murmurs rubs or gallops GI: nondistended skin: No rash, skin intact Musculoskeletal: no deformities Neurologic: Alert & oriented x 3, no focal neuro deficits Psychiatric: Speech and behavior appropriate   ED Course   Medications - No data to display  Orders Placed This Encounter  Procedures  . DG Chest 2 View    Standing Status:   Standing    Number of Occurrences:   1    Order Specific Question:   Reason for Exam (SYMPTOM  OR DIAGNOSIS REQUIRED)    Answer:   COVID + hemoptysis today.  Rule out pneumomediastinum.    No results found for this or any previous visit (from the past 24 hour(s)). DG Chest 2 View  Result Date: 02/07/2020 CLINICAL DATA:  COVID + hemoptysis today. Rule out pneumomediastinum. EXAM: CHEST - 2 VIEW COMPARISON:  None. FINDINGS: Patchy bibasilar opacities. No pneumothorax or pleural effusion. Cardiomediastinal silhouette within normal limits. Multilevel spondylosis. IMPRESSION: Patchy bibasilar opacities, atelectasis versus infiltrate. No evidence of pneumomediastinum. Electronically Signed   By: Stana Bunting M.D.   On: 02/07/2020 11:31    ED Clinical Impression  1. COVID-19 virus infection  2. Essential hypertension      ED Assessment/Plan  Previous labs, records reviewed.  As noted in HPI  1.  Covid infection with hemoptysis.  Will obtain chest x-ray to rule out pneumomediastinum   Reviewed imaging independently.  Bibasilar opacities atelectasis versus infiltrate.  No evidence of pneumomediastinum.  See radiology report for details  Patient has atelectasis versus infiltrate.  Possible COVID pneumonia.  No antibiotics indicated today.  Albuterol inhaler with  a spacer as needed, Tessalon, Flonase, Robitussin as needed.  Follow-up with PMD of choice ASAP for follow-up and monitoring of blood pressure.  2. Blood pressure also noted. has been high on the past several visits.  He states it has been running high recently the 170s over 90s at home.  Could be because of the COVID.  He currently denies having any symptoms.. Pt has no historical evidence of end organ damage. Pt denies any CNS type sx such as HA, visual changes, focal paresis, or new onset seizure activity. Pt denies any CV sx such as CP, dyspnea, palpitations, pedal edema, tearing pain radiating to back or abd. Pt denied any renal sx such as anuria or hematuria. Pt denies  recent use of OTC medications such as nasal decongestants.  We will have him keep a log of his blood pressure, and gave him hypertensive emergency ER return precautions.  Providing primary care list for ongoing care. . Discussed MDM, treatment plan, and plan for follow-up with patient. Discussed sn/sx that should prompt return to the ED. patient agrees with plan.   Meds ordered this encounter  Medications  . benzonatate (TESSALON) 200 MG capsule    Sig: Take 1 capsule (200 mg total) by mouth 3 (three) times daily as needed for cough.    Dispense:  30 capsule    Refill:  0    *This clinic note was created using Scientist, clinical (histocompatibility and immunogenetics). Therefore, there may be occasional mistakes despite careful proofreading.   ?    Domenick Gong, MD 02/07/20 1404

## 2020-03-13 ENCOUNTER — Other Ambulatory Visit: Payer: Self-pay

## 2020-03-13 ENCOUNTER — Ambulatory Visit (HOSPITAL_COMMUNITY)
Admission: EM | Admit: 2020-03-13 | Discharge: 2020-03-13 | Disposition: A | Discharge status: Home / Self Care | Payer: Self-pay | Attending: Student | Admitting: Student

## 2020-03-13 ENCOUNTER — Encounter (HOSPITAL_COMMUNITY): Payer: Self-pay | Admitting: Emergency Medicine

## 2020-03-13 DIAGNOSIS — N342 Other urethritis: Secondary | ICD-10-CM | POA: Insufficient documentation

## 2020-03-13 DIAGNOSIS — N4889 Other specified disorders of penis: Secondary | ICD-10-CM

## 2020-03-13 DIAGNOSIS — R311 Benign essential microscopic hematuria: Secondary | ICD-10-CM | POA: Insufficient documentation

## 2020-03-13 DIAGNOSIS — I10 Essential (primary) hypertension: Secondary | ICD-10-CM | POA: Insufficient documentation

## 2020-03-13 DIAGNOSIS — Z113 Encounter for screening for infections with a predominantly sexual mode of transmission: Secondary | ICD-10-CM | POA: Insufficient documentation

## 2020-03-13 LAB — POCT URINALYSIS DIPSTICK, ED / UC
Bilirubin Urine: NEGATIVE
Glucose, UA: NEGATIVE mg/dL
Ketones, ur: NEGATIVE mg/dL
Leukocytes,Ua: NEGATIVE
Nitrite: NEGATIVE
Protein, ur: NEGATIVE mg/dL
Specific Gravity, Urine: 1.025 (ref 1.005–1.030)
Urobilinogen, UA: 0.2 mg/dL (ref 0.0–1.0)
pH: 7 (ref 5.0–8.0)

## 2020-03-13 MED ORDER — LIDOCAINE HCL (PF) 1 % IJ SOLN
INTRAMUSCULAR | Status: AC
  Filled 2020-03-13: qty 2

## 2020-03-13 MED ORDER — AMLODIPINE BESYLATE 5 MG PO TABS: 5.0000 mg | ORAL_TABLET | Freq: Every day | ORAL | 2 refills | Status: AC

## 2020-03-13 MED ORDER — CEFTRIAXONE SODIUM 500 MG IJ SOLR
500.0000 mg | Freq: Once | INTRAMUSCULAR | Status: AC
  Administered 2020-03-13: 500 mg via INTRAMUSCULAR

## 2020-03-13 MED ORDER — DOXYCYCLINE HYCLATE 100 MG PO CAPS: 100.0000 mg | ORAL_CAPSULE | Freq: Two times a day (BID) | ORAL | 0 refills | Status: AC

## 2020-03-13 MED ORDER — CEFTRIAXONE SODIUM 500 MG IJ SOLR
INTRAMUSCULAR | Status: AC
  Filled 2020-03-13: qty 500

## 2020-03-13 NOTE — ED Provider Notes (Addendum)
[facility]    CSN: [id number] Arrival date & time: 03/13/20  1150      History   Chief Complaint Chief Complaint  Patient presents with  . Dysuria  . Urinary Frequency    HPI Justin Espinoza is a 45 y.o. male presenting with dysuria, urinary frequency x12 hours, getting worse. Endorses dysuria, frequency. Dysuria at rest but worse with urination. States pain feels like it's inside penis. Weak stream. Denies penile discharge or lesions, denies back pain, denies abd pain, denies gross hematuria. Unprotected intercourse with new partner 3 months ago and no STI screen since then.  He also states he hasn't been taking his amlodipine as directed. He needs a refill on this. Not currenlty followed by PCP. Denies chest pain, vision changes, headaches, etc.   HPI  Past Medical History:  Diagnosis Date  . Hypertension     There are no problems to display for this patient.   History reviewed. No pertinent surgical history.     Home Medications    Prior to Admission medications   Medication Sig Start Date End Date Taking? Authorizing Provider  amLODipine (NORVASC) 5 MG tablet Take 1 tablet (5 mg total) by mouth daily. 03/13/20  Yes Rhys Martini, PA-C  doxycycline (VIBRAMYCIN) 100 MG capsule Take 1 capsule (100 mg total) by mouth 2 (two) times daily for 7 days. 03/13/20 03/20/20 Yes Rhys Martini, PA-C  albuterol (VENTOLIN HFA) 108 (90 Base) MCG/ACT inhaler Inhale 1-2 puffs into the lungs every 4 (four) hours as needed for wheezing or shortness of breath. 02/07/20   Domenick Gong, MD  benzonatate (TESSALON) 200 MG capsule Take 1 capsule (200 mg total) by mouth 3 (three) times daily as needed for cough. 02/07/20   Domenick Gong, MD  Spacer/Aero-Holding Chambers (AEROCHAMBER PLUS) inhaler Use with inhaler 02/07/20   Domenick Gong, MD    Family History Family History  Problem Relation Age of Onset  . Hypertension Mother   . Diabetes Mother   . Hypertension  Father   . Diabetes Father     Social History Social History   Tobacco Use  . Smoking status: Never Smoker  . Smokeless tobacco: Never Used  Substance Use Topics  . Alcohol use: Not Currently  . Drug use: No     Allergies   Patient has no known allergies.   Review of Systems Review of Systems  Constitutional: Negative for chills and fever.  HENT: Negative for sore throat.   Eyes: Negative for pain and redness.  Respiratory: Negative for shortness of breath.   Cardiovascular: Negative for chest pain.  Gastrointestinal: Negative for abdominal pain, diarrhea, nausea and vomiting.  Genitourinary: Positive for dysuria, frequency and penile pain. Negative for decreased urine volume, difficulty urinating, flank pain, genital sores, hematuria, penile discharge, penile swelling, scrotal swelling, testicular pain and urgency.  Musculoskeletal: Negative for back pain.  Skin: Negative for rash.     Physical Exam Triage Vital Signs ED Triage Vitals  Enc Vitals Group     BP 03/13/20 1215 (!) 184/125     Pulse Rate 03/13/20 1215 (!) 115     Resp 03/13/20 1215 19     Temp 03/13/20 1215 98.5 F (36.9 C)     Temp Source 03/13/20 1215 Oral     SpO2 03/13/20 1215 98 %     Weight --      Height --      Head Circumference --      Peak Flow --  Pain Score 03/13/20 1214 3     Pain Loc --      Pain Edu? --      Excl. in [place]? --    No data found.  Updated Vital Signs BP (!) 184/125 (BP Location: Left Arm)   Pulse (!) 115   Temp 98.5 F (36.9 C) (Oral)   Resp 19   SpO2 98%   Visual Acuity Right Eye Distance:   Left Eye Distance:   Bilateral Distance:    Right Eye Near:   Left Eye Near:    Bilateral Near:     Physical Exam Vitals reviewed. Exam conducted with a chaperone present.  Constitutional:      General: He is not in acute distress.    Appearance: Normal appearance. He is not ill-appearing.  HENT:     Head: Normocephalic and atraumatic.  Cardiovascular:      Rate and Rhythm: Regular rhythm. Tachycardia present.     Heart sounds: Normal heart sounds.  Pulmonary:     Effort: Pulmonary effort is normal.     Breath sounds: Normal breath sounds. No wheezing, rhonchi or rales.  Abdominal:     General: Bowel sounds are normal. There is no distension.     Palpations: Abdomen is soft. There is no mass.     Tenderness: There is no abdominal tenderness. There is no right CVA tenderness, left CVA tenderness, guarding or rebound. Negative signs include Murphy's sign, Rovsing's sign and McBurney's sign.     Comments: Bowel sounds positive in all 4 quadrants. No tenderness to palpation. Negative Murphy Sign, Rovsing's sign, McBurney point tenderness.   Genitourinary:    Penis: Normal and circumcised. No phimosis, paraphimosis, hypospadias, erythema, tenderness, discharge, swelling or lesions.      Testes: Normal.        Right: Mass, tenderness, swelling, testicular hydrocele or varicocele not present. Right testis is descended. Cremasteric reflex is present.         Left: Mass, tenderness, swelling, testicular hydrocele or varicocele not present. Left testis is descended. Cremasteric reflex is present.   Neurological:     General: No focal deficit present.     Mental Status: He is alert and oriented to person, place, and time.     Comments: CN 2-12 grossly intact  Psychiatric:        Mood and Affect: Mood is anxious.        Behavior: Behavior normal.      UC Treatments / Results  Labs (all labs ordered are listed, but only abnormal results are displayed) Labs Reviewed  POCT URINALYSIS DIPSTICK, ED / UC - Abnormal; Notable for the following components:      Result Value   Hgb urine dipstick MODERATE (*)    All other components within normal limits  URINE CULTURE  CYTOLOGY, (ORAL, ANAL, URETHRAL) ANCILLARY ONLY    EKG   Radiology No results found.  Procedures Procedures (including critical care time)  Medications Ordered in  UC Medications  cefTRIAXone (ROCEPHIN) injection 500 mg (has no administration in time range)    Initial Impression / Assessment and Plan / UC Course  I have reviewed the triage vital signs and the nursing notes.  Pertinent labs & imaging results that were available during my care of the patient were reviewed by me and considered in my medical decision making (see chart for details).     afebrile tachycardic nontachypneic, oxygenating well on room air.   -For urethritis, Rocephin and doxycycline as below. -  Make sure to drink plenty of water -If your symptoms worsen/persist despite treatment, please call urology for further evaluation (information provided). Head to ED if symptoms worsen instead of improve; if you cannot urinate; etc.  -I'm also refilling your bloodpressure medication, amlodipine 5mg  (1 pill) once daily. Please restart this. Head to ED if headaches, chest pain, vision changes, etc.   We have sent testing for sexually transmitted infections- G/C, trichomonas. Declines HIV, RPR> We will notify you of any positive results once they are received. If required, we will prescribe any medications you might need. Already treating with rocephin and doxycycline today.   Please refrain from all sexual activity for at least the next seven days.  Seek additional medical attention if you develop fevers/chills, new/worsening abdominal pain, new/worsening vaginal discomfort/discharge, etc. Patient verbalizes understanding and agreement.   Final Clinical Impressions(s) / UC Diagnoses   Final diagnoses:  Essential hypertension  Benign essential microscopic hematuria  Routine screening for STI (sexually transmitted infection)  Urethritis     Discharge Instructions     -For urethritis, we're treating you with Rocephin injection and doxycycline. Please pick doxycyline up from your pharmacy. Take this twice daily for 7 days. Rocephin is the treatment for gonorrhea, and doxycycline is  the treatment for chlamydia; these are the most common causes of urethritis.  -Make sure to drink plenty of water -If your symptoms worsen/persist despite treatment, please call urology for further evaluation (information below) -I'm also refilling your bloodpressure medication, amlodipine 5mg  (1 pill) once daily. Please restart this. Head to ED if headaches, chest pain, vision changes, etc.     ED Prescriptions    Medication Sig Dispense Auth. Provider   amLODipine (NORVASC) 5 MG tablet Take 1 tablet (5 mg total) by mouth daily. 30 tablet , PA-C   doxycycline (VIBRAMYCIN) 100 MG capsule Take 1 capsule (100 mg total) by mouth 2 (two) times daily for 7 days. 14 capsule , PA-C     PDMP not reviewed this encounter.   Rhys Martini, PA-C 03/13/20 1314    Rhys Martini, PA-C 03/13/20 1314    Rhys Martini, PA-C 03/13/20 1315

## 2020-03-13 NOTE — Discharge Instructions (Addendum)
-  For urethritis, we're treating you with Rocephin injection and doxycycline. Please pick doxycyline up from your pharmacy. Take this twice daily for 7 days. Rocephin is the treatment for gonorrhea, and doxycycline is the treatment for chlamydia; these are the most common causes of urethritis.  -Make sure to drink plenty of water -If your symptoms worsen/persist despite treatment, please call urology for further evaluation (information below) -I'm also refilling your bloodpressure medication, amlodipine 5mg  (1 pill) once daily. Please restart this. Head to ED if headaches, chest pain, vision changes, etc.

## 2020-03-13 NOTE — ED Triage Notes (Signed)
Pt presents with dysuria and frequency that started last night. States has soreness and sharp pain in penis.

## 2020-03-14 LAB — CYTOLOGY, (ORAL, ANAL, URETHRAL) ANCILLARY ONLY
Chlamydia: NEGATIVE
Comment: NEGATIVE
Comment: NEGATIVE
Comment: NORMAL
Neisseria Gonorrhea: NEGATIVE
Trichomonas: NEGATIVE

## 2020-03-14 LAB — URINE CULTURE: Culture: <10000 — AB

## 2021-04-12 ENCOUNTER — Ambulatory Visit (HOSPITAL_COMMUNITY)
Admission: EM | Admit: 2021-04-12 | Discharge: 2021-04-12 | Disposition: A | Discharge status: Home / Self Care | Payer: Self-pay | Attending: Physician Assistant | Admitting: Physician Assistant

## 2021-04-12 ENCOUNTER — Other Ambulatory Visit: Payer: Self-pay

## 2021-04-12 ENCOUNTER — Encounter (HOSPITAL_COMMUNITY): Payer: Self-pay

## 2021-04-12 DIAGNOSIS — H5789 Other specified disorders of eye and adnexa: Secondary | ICD-10-CM

## 2021-04-12 DIAGNOSIS — I10 Essential (primary) hypertension: Secondary | ICD-10-CM

## 2021-04-12 MED ORDER — TETRACAINE HCL 0.5 % OP SOLN
OPHTHALMIC | Status: AC
  Filled 2021-04-12: qty 4

## 2021-04-12 MED ORDER — FLUORESCEIN SODIUM 1 MG OP STRP
ORAL_STRIP | OPHTHALMIC | Status: AC
Start: 2021-04-12 — End: ?
  Filled 2021-04-12: qty 1

## 2021-04-12 MED ORDER — ERYTHROMYCIN 5 MG/GM OP OINT: TOPICAL_OINTMENT | OPHTHALMIC | 0 refills | Status: AC

## 2021-04-12 NOTE — ED Triage Notes (Signed)
Pt reports he was replacing a starter and states some of the Debr got into his eye. States it happened yesterday. States it is irritated and water.

## 2021-04-12 NOTE — Discharge Instructions (Addendum)
Your I do not have any scratch on it.  I am hopeful that the irritation will continue to improve.  Use erythromycin ointment twice daily.  Do not touch the tip of bottle to your eye and make sure to wash your hands prior to handling medication to avoid contamination of the medicine.  Use lubricating eyedrops and eye wash for additional symptom relief.  Clean your eye with a warm compress.  If symptoms or not improving or if any point anything is worsening you need to be seen by specialist; please call to schedule appointment with ophthalmologist as discussed.  Your blood pressure is very elevated.  We will try to establish with a primary care provider via PCP assistance.  I would recommend restarting your amlodipine as prescribed.  Monitor your blood pressure at home.  Avoid salt, caffeine, decongestants, NSAIDs (aspirin, ibuprofen/Advil, naproxen/Aleve).  If you develop any chest pain, shortness of breath, dizziness, visual changes, headache in the setting of high blood pressure you need to go to the emergency room as we discussed.

## 2021-04-12 NOTE — ED Provider Notes (Signed)
[facility]    CSN: [id number] Arrival date & time: 04/12/21  [id number]      History   Chief Complaint Chief Complaint  Patient presents with   eye irritation    HPI Justin Espinoza is a 46 y.o. male.   Patient presents today with a 2-day history of right eye irritation.  Reports that he was replacing a starter in a vehicle when a piece of dust got into his eye.  He had a 24-hour timeframe of foreign body sensation but used eye irrigation and over-the-counter eyewash with resolution of symptoms.  He denies any current foreign body sensation, photosensitivity, ocular pain, headache, dizziness, nausea, vomiting.  He does not wear glasses or contacts.  He does not see an ophthalmologist regularly.  He typically wears protective eyewear but was not wearing it prior to episode.  Denies any harsh chemical exposure.  Reports symptoms are improving today but wanted to be evaluated.  Patient has a history of hypertension.  He is not currently taking amlodipine as previously prescribed as it caused side effects including increased urination.  He reports monitoring his blood pressure consistently at home and this is typically 140/90 or less.  He is trying to use dietary and lifestyle modification to manage his blood pressure.  He has not followed by PCP but is open to establishing with 1.  He denies any chest pain, shortness of breath, vision changes, headache, dizziness.  He does have amlodipine available at home and does not require refill of this medication.   Past Medical History:  Diagnosis Date   Hypertension     There are no problems to display for this patient.   History reviewed. No pertinent surgical history.     Home Medications    Prior to Admission medications   Medication Sig Start Date End Date Taking? Authorizing Provider  erythromycin ophthalmic ointment Place a 1/2 inch ribbon of ointment into the lower eyelid of right eye twice daily 04/12/21  Yes Kendell Sagraves K,  PA-C  albuterol (VENTOLIN HFA) 108 (90 Base) MCG/ACT inhaler Inhale 1-2 puffs into the lungs every 4 (four) hours as needed for wheezing or shortness of breath. 02/07/20   Melynda Ripple, MD  amLODipine (NORVASC) 5 MG tablet Take 1 tablet (5 mg total) by mouth daily. 03/13/20   Hazel Sams, PA-C  benzonatate (TESSALON) 200 MG capsule Take 1 capsule (200 mg total) by mouth 3 (three) times daily as needed for cough. 02/07/20   Melynda Ripple, MD  Spacer/Aero-Holding Chambers (AEROCHAMBER PLUS) inhaler Use with inhaler 02/07/20   Melynda Ripple, MD    Family History Family History  Problem Relation Age of Onset   Hypertension Mother    Diabetes Mother    Hypertension Father    Diabetes Father     Social History Social History   Tobacco Use   Smoking status: Never   Smokeless tobacco: Never  Substance Use Topics   Alcohol use: Not Currently   Drug use: No     Allergies   Patient has no known allergies.   Review of Systems Review of Systems  Constitutional:  Positive for activity change. Negative for appetite change, fatigue and fever.  Eyes:  Positive for discharge, redness and itching. Negative for photophobia, pain and visual disturbance.  Respiratory:  Negative for cough and shortness of breath.   Cardiovascular:  Negative for chest pain.  Gastrointestinal:  Negative for abdominal pain, diarrhea, nausea and vomiting.  Neurological:  Negative for dizziness, light-headedness  and headaches.    Physical Exam Triage Vital Signs ED Triage Vitals  Enc Vitals Group     BP 04/12/21 0826 (!) 198/119     Pulse Rate 04/12/21 0825 96     Resp 04/12/21 0825 18     Temp 04/12/21 0825 98.3 F (36.8 C)     Temp Source 04/12/21 0825 Oral     SpO2 04/12/21 0825 100 %     Weight --      Height --      Head Circumference --      Peak Flow --      Pain Score 04/12/21 0824 1     Pain Loc --      Pain Edu? --      Excl. in [place]? --    No data found.  Updated Vital  Signs BP (!) 168/103 (BP Location: Left Arm)    Pulse 96    Temp 98.3 F (36.8 C) (Oral)    Resp 18    SpO2 100%   Visual Acuity Right Eye Distance: 20/30 Left Eye Distance: 20/25 Bilateral Distance: 20/20  Right Eye Near:   Left Eye Near:    Bilateral Near:     Physical Exam Vitals reviewed.  Constitutional:      General: He is awake.     Appearance: Normal appearance. He is well-developed. He is not ill-appearing.     Comments: Very pleasant male appears stated age no acute distress sitting comfortably in exam room  HENT:     Head: Normocephalic and atraumatic.  Eyes:     General: Lids are everted, no foreign bodies appreciated.     Extraocular Movements: Extraocular movements intact.     Conjunctiva/sclera:     Right eye: Right conjunctiva is injected.     Left eye: Left conjunctiva is not injected.     Pupils: Pupils are equal, round, and reactive to light.     Right eye: No corneal abrasion or fluorescein uptake.  Cardiovascular:     Rate and Rhythm: Normal rate and regular rhythm.     Heart sounds: Normal heart sounds, S1 normal and S2 normal. No murmur heard. Pulmonary:     Effort: Pulmonary effort is normal.     Breath sounds: Normal breath sounds. No stridor. No wheezing, rhonchi or rales.     Comments: Clear to auscultation bilaterally Neurological:     Mental Status: He is alert.  Psychiatric:        Behavior: Behavior is cooperative.     UC Treatments / Results  Labs (all labs ordered are listed, but only abnormal results are displayed) Labs Reviewed - No data to display  EKG   Radiology No results found.  Procedures Procedures (including critical care time)  Medications Ordered in UC Medications - No data to display  Initial Impression / Assessment and Plan / UC Course  I have reviewed the triage vital signs and the nursing notes.  Pertinent labs & imaging results that were available during my care of the patient were reviewed by me and  considered in my medical decision making (see chart for details).     No obvious corneal abrasion on exam with fluorescein staining.  Patient denies any chemical exposure.  Recommended he continue with eyewash and lubricating eyedrops for symptom relief.  Will cover with erythromycin ointment twice daily.  Discussed that this medication can blurred his vision for several minutes after application given consistency of medicine.  Discussed that if symptoms  or not improving quickly if he develops any worsening symptoms including photophobia or vision change he would need to see an ophthalmologist and was given contact information for local provider.  Discussed alarm symptoms that warrant emergent evaluation.  Strict return precautions given to which patient expressed understanding.  Blood pressure is very elevated today.  Discussed the harms of persistently elevated blood pressure but patient reports that this is generally normal at home and he has history of whitecoat hypertension.  Encouraged him to begin taking amlodipine he has available given elevated reading in clinic today.  He is to continue monitoring his blood pressure.  Recommended he avoid salt and drink plenty of fluid.  He does not currently have a primary care provider so we will try to establish him with 1 via PCP assistance.  Discussed that if he has elevated blood pressure with associated headache, dizziness, chest pain, visual disturbance he must go to the emergency room to which he expressed understanding.  Final Clinical Impressions(s) / UC Diagnoses   Final diagnoses:  Eye irritation  Elevated blood pressure reading in office with diagnosis of hypertension     Discharge Instructions      Your I do not have any scratch on it.  I am hopeful that the irritation will continue to improve.  Use erythromycin ointment twice daily.  Do not touch the tip of bottle to your eye and make sure to wash your hands prior to handling medication  to avoid contamination of the medicine.  Use lubricating eyedrops and eye wash for additional symptom relief.  Clean your eye with a warm compress.  If symptoms or not improving or if any point anything is worsening you need to be seen by specialist; please call to schedule appointment with ophthalmologist as discussed.  Your blood pressure is very elevated.  We will try to establish with a primary care provider via PCP assistance.  I would recommend restarting your amlodipine as prescribed.  Monitor your blood pressure at home.  Avoid salt, caffeine, decongestants, NSAIDs (aspirin, ibuprofen/Advil, naproxen/Aleve).  If you develop any chest pain, shortness of breath, dizziness, visual changes, headache in the setting of high blood pressure you need to go to the emergency room as we discussed.     ED Prescriptions     Medication Sig Dispense Auth. Provider   erythromycin ophthalmic ointment Place a 1/2 inch ribbon of ointment into the lower eyelid of right eye twice daily 3.5 g Otis Burress K, PA-C      PDMP not reviewed this encounter.   Terrilee Croak, PA-C 04/12/21 [id number]

## 2022-01-21 IMAGING — DX DG CHEST 2V
2 series · 2 of 2 positions shown · non-contrast
Comparison: None.

CLINICAL DATA: COVID + hemoptysis today. Rule out
pneumomediastinum.

EXAM:
CHEST - 2 VIEW

[chest pa]
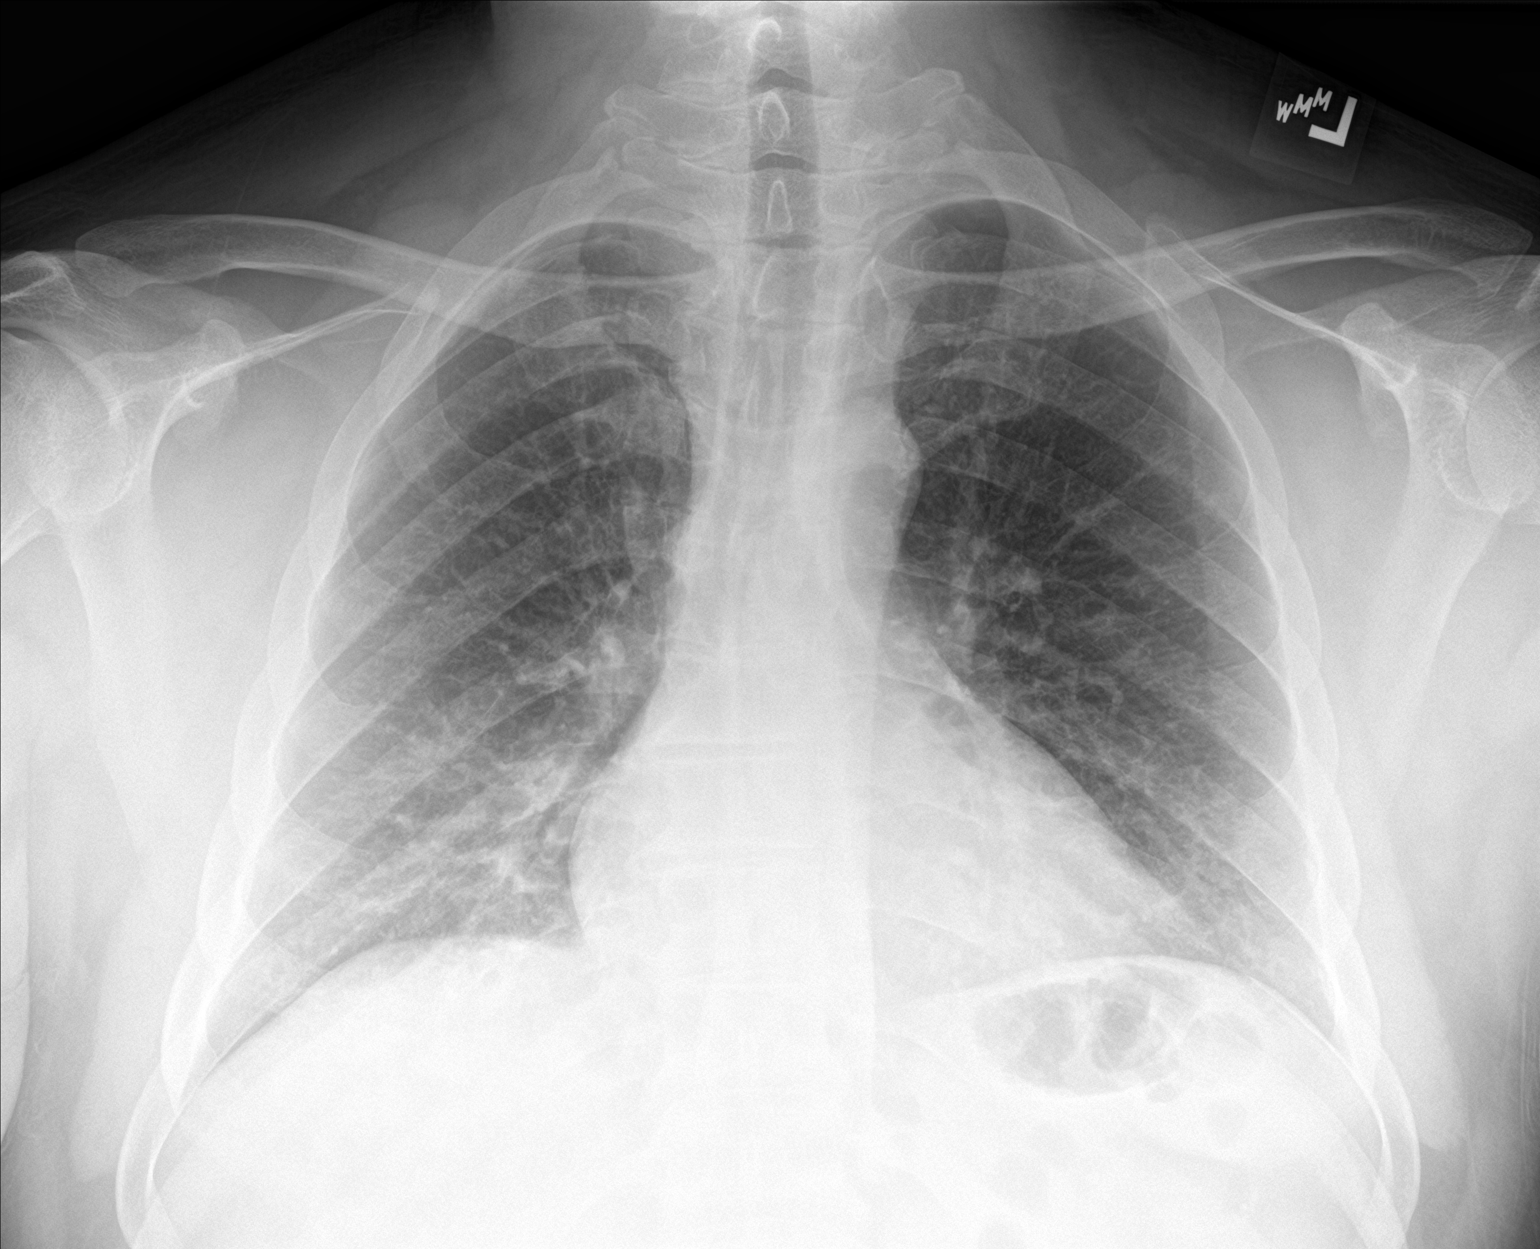

[chest lat]
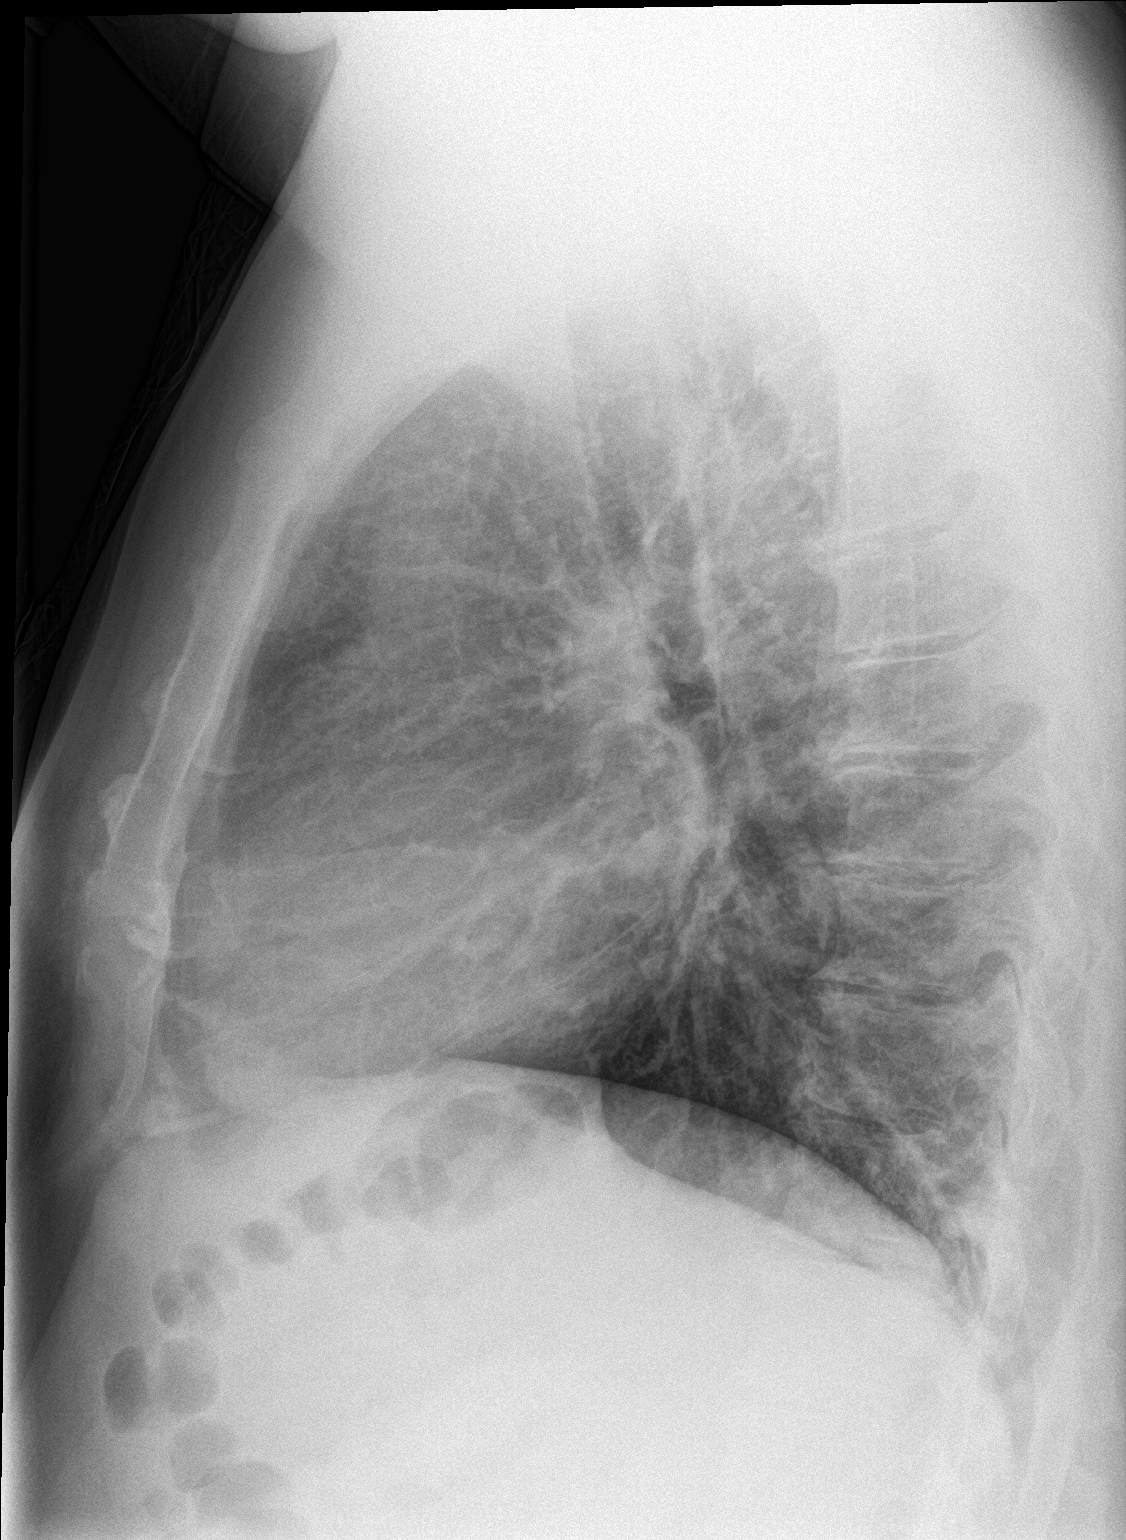

[2 of 2 positions shown; findings below may reference images not displayed]

FINDINGS: Patchy bibasilar opacities. No pneumothorax or pleural effusion.
Cardiomediastinal silhouette within normal limits. Multilevel
spondylosis.
IMPRESSION: Patchy bibasilar opacities, atelectasis versus infiltrate.

No evidence of pneumomediastinum.

## 2023-05-12 ENCOUNTER — Ambulatory Visit (HOSPITAL_COMMUNITY)
Admission: EM | Admit: 2023-05-12 | Discharge: 2023-05-12 | Disposition: A | Discharge status: Home / Self Care | Payer: Self-pay | Attending: Family Medicine | Admitting: Family Medicine

## 2023-05-12 ENCOUNTER — Encounter (HOSPITAL_COMMUNITY): Payer: Self-pay

## 2023-05-12 DIAGNOSIS — J029 Acute pharyngitis, unspecified: Secondary | ICD-10-CM | POA: Insufficient documentation

## 2023-05-12 LAB — POCT RAPID STREP A (OFFICE): Rapid Strep A Screen: NEGATIVE

## 2023-05-12 MED ORDER — AMOXICILLIN 500 MG PO CAPS: 500.0000 mg | ORAL_CAPSULE | Freq: Two times a day (BID) | ORAL | 0 refills | Status: AC

## 2023-05-12 MED ORDER — IBUPROFEN 800 MG PO TABS: 800.0000 mg | ORAL_TABLET | Freq: Three times a day (TID) | ORAL | 0 refills | Status: AC | PRN

## 2023-05-12 NOTE — ED Triage Notes (Signed)
 Pt c/o fever on Saturday and now has a sore throat with swollen tonsils with white patches. States alternating tylenol and ibuprofen.

## 2023-05-12 NOTE — ED Provider Notes (Signed)
 [facility]  Note:  This document was prepared using Dragon voice recognition software and may include unintentional dictation errors.  MRN: [id number] DOB: [birth year]  Subjective:   Justin Espinoza is a 48 y.o. male presenting for sore throat and fever since Sunday.  Patient reports he has been taking Tylenol and ibuprofen as needed for fever and pain.  Patient denies any known sick contacts.  Denies any known past history of strep pharyngitis.  No shortness of breath, chest pain, weakness, dizziness.  No cough, body aches, nasal congestion, ear pain.  No current facility-administered medications for this encounter.  Current Outpatient Medications:    amoxicillin (AMOXIL) 500 MG capsule, Take 1 capsule (500 mg total) by mouth 2 (two) times daily for 10 days., Disp: 20 capsule, Rfl: 0   ibuprofen (ADVIL) 800 MG tablet, Take 1 tablet (800 mg total) by mouth every 8 (eight) hours as needed for fever or moderate pain (pain score 4-6)., Disp: 30 tablet, Rfl: 0   albuterol (VENTOLIN HFA) 108 (90 Base) MCG/ACT inhaler, Inhale 1-2 puffs into the lungs every 4 (four) hours as needed for wheezing or shortness of breath., Disp: 1 each, Rfl: 0   amLODipine (NORVASC) 5 MG tablet, Take 1 tablet (5 mg total) by mouth daily., Disp: 30 tablet, Rfl: 2   erythromycin ophthalmic ointment, Place a 1/2 inch ribbon of ointment into the lower eyelid of right eye twice daily, Disp: 3.5 g, Rfl: 0   Spacer/Aero-Holding Chambers (AEROCHAMBER PLUS) inhaler, Use with inhaler, Disp: 1 each, Rfl: 2   No Known Allergies  Past Medical History:  Diagnosis Date   Hypertension      History reviewed. No pertinent surgical history.  Family History  Problem Relation Age of Onset   Hypertension Mother    Diabetes Mother    Hypertension Father    Diabetes Father     Social History   Tobacco Use   Smoking status: Never   Smokeless tobacco: Never  Substance Use Topics   Alcohol use: Not Currently    Drug use: No    ROS Refer to HPI for ROS details.  Objective:   Vitals: BP (!) 186/120 (BP Location: Left Arm)   Pulse (!) 101   Temp 99.5 F (37.5 C) (Oral)   Resp 18   SpO2 97%   Physical Exam Vitals and nursing note reviewed.  Constitutional:      General: He is not in acute distress.    Appearance: He is well-developed. He is not ill-appearing or toxic-appearing.  HENT:     Head: Normocephalic.     Mouth/Throat:     Mouth: Mucous membranes are moist. No oral lesions.     Pharynx: Pharyngeal swelling, oropharyngeal exudate and posterior oropharyngeal erythema present. No uvula swelling.     Tonsils: Tonsillar exudate present. No tonsillar abscesses. 3+ on the right. 3+ on the left.  Eyes:     Conjunctiva/sclera: Conjunctivae normal.  Cardiovascular:     Rate and Rhythm: Normal rate.  Pulmonary:     Effort: Pulmonary effort is normal. No respiratory distress.  Musculoskeletal:        General: No swelling.     Cervical back: Neck supple.  Lymphadenopathy:     Cervical: No cervical adenopathy.  Skin:    General: Skin is warm and dry.  Neurological:     General: No focal deficit present.     Mental Status: He is alert and oriented to person, place, and time.  Psychiatric:  Mood and Affect: Mood normal.     Procedures  Results for orders placed or performed during the hospital encounter of 05/12/23 (from the past 24 hours)  POC rapid strep A     Status: None   Collection Time: 05/12/23  8:48 AM  Result Value Ref Range   Rapid Strep A Screen Negative Negative    Assessment and Plan :   PDMP not reviewed this encounter.  1. Acute pharyngitis, unspecified etiology    1. Acute pharyngitis, unspecified etiology (Primary) - POC rapid strep A is negative for strep pharyngitis - Culture, group A strep (throat) sent to lab for further testing results should be available in 2 to 3 days. - amoxicillin (AMOXIL) 500 MG capsule; Take 1 capsule (500 mg total) by  mouth 2 (two) times daily for 10 days for suspected strep pharyngitis although rapid testing was negative. - ibuprofen (ADVIL) 800 MG tablet; Take 1 tablet (800 mg total) by mouth every 8 (eight) hours as needed for fever or moderate pain (pain score 4-6) secondary to suspected strep pharyngitis. -Take medications as directed and continue to monitor symptoms for any change in severity if there is any escalation of current symptoms or development of new symptoms follow-up for further evaluation and management.  [employer] [employer]   [place], [place] [address], [place] 05/12/23 [phone]

## 2023-05-12 NOTE — Discharge Instructions (Addendum)
 1. Acute pharyngitis, unspecified etiology (Primary) - POC rapid strep A is negative for strep pharyngitis - Culture, group A strep (throat) sent to lab for further testing results should be available in 2 to 3 days. - amoxicillin (AMOXIL) 500 MG capsule; Take 1 capsule (500 mg total) by mouth 2 (two) times daily for 10 days for suspected strep pharyngitis although rapid testing was negative. - ibuprofen (ADVIL) 800 MG tablet; Take 1 tablet (800 mg total) by mouth every 8 (eight) hours as needed for fever or moderate pain (pain score 4-6) secondary to suspected strep pharyngitis. -Take medications as directed and continue to monitor symptoms for any change in severity if there is any escalation of current symptoms or development of new symptoms follow-up for further evaluation and management.

## 2023-05-15 LAB — CULTURE, GROUP A STREP (THRC)
